# Patient Record
Sex: Male | Born: 1985 | Race: White | Hispanic: No | Marital: Married | Smoking: Never smoker
Health system: Southern US, Community
[De-identification: ages and names within clinical notes are randomized; demographics above are authoritative.]

## PROBLEM LIST (undated history)

## (undated) DIAGNOSIS — T8859XA Other complications of anesthesia, initial encounter: Secondary | ICD-10-CM

## (undated) DIAGNOSIS — T4145XA Adverse effect of unspecified anesthetic, initial encounter: Secondary | ICD-10-CM

## (undated) DIAGNOSIS — K219 Gastro-esophageal reflux disease without esophagitis: Secondary | ICD-10-CM

## (undated) HISTORY — PX: LAPAROSCOPIC NISSEN FUNDOPLICATION: SHX1932

---

## 2015-02-12 ENCOUNTER — Ambulatory Visit: Payer: BC Managed Care – PPO

## 2015-02-12 ENCOUNTER — Encounter: Payer: Self-pay | Admitting: Emergency Medicine

## 2015-02-12 ENCOUNTER — Ambulatory Visit
Admission: EM | Admit: 2015-02-12 | Discharge: 2015-02-12 | Disposition: A | Discharge status: Home / Self Care | Payer: BC Managed Care – PPO | Attending: Family Medicine | Admitting: Family Medicine

## 2015-02-12 DIAGNOSIS — J40 Bronchitis, not specified as acute or chronic: Secondary | ICD-10-CM

## 2015-02-12 MED ORDER — PREDNISONE 20 MG PO TABS: 20.0000 mg | ORAL_TABLET | Freq: Every day | ORAL | Status: DC

## 2015-02-12 MED ORDER — GUAIFENESIN-CODEINE 100-10 MG/5ML PO SOLN: 5.0000 mL | Freq: Three times a day (TID) | ORAL | Status: DC | PRN

## 2015-02-12 MED ORDER — AZITHROMYCIN 250 MG PO TABS: ORAL_TABLET | ORAL | Status: DC

## 2015-02-12 NOTE — ED Provider Notes (Signed)
[facility] Emergency Department Provider Note  ____________________________________________  Time seen: Approximately 3:22 PM  I have reviewed the triage vital signs and the nursing notes.   HISTORY  Chief Complaint Cough   HPI Erik FraiseChristopher Castaneda is a 29 y.o. male presents with a complaint of cough. Patient states that over the last 2-3 weeks he has had some runny nose, nasal congestion and sinus drainage. States the symptoms have improved but cough has continued and increased. Patient states biggest complaint is cough. States occasionally can cough up thick phlegm but states mostly the cough is dry. Denies chest pain, shortness breath or wheezing. Denies pain. Denies fever. Reports continues to eat and drink well.   History reviewed. No pertinent past medical history.  There are no active problems to display for this patient.   History reviewed. No pertinent past surgical history.  No current outpatient prescriptions on file.  Allergies Review of patient's allergies indicates no known allergies.  History reviewed. No pertinent family history.  Social History Social History  Substance Use Topics  . Smoking status: Never Smoker   . Smokeless tobacco: None  . Alcohol Use: Yes    Review of Systems Constitutional: No fever/chills Eyes: No visual changes. ENT: reports runny nose, nasal congestion and cough Cardiovascular: Denies chest pain. Respiratory: Denies shortness of breath. Gastrointestinal: No abdominal pain.  No nausea, no vomiting.  No diarrhea.  No constipation. Genitourinary: Negative for dysuria. Musculoskeletal: Negative for back pain. Skin: Negative for rash. Neurological: Negative for headaches, focal weakness or numbness.  10-point ROS otherwise negative.  ____________________________________________   PHYSICAL EXAM:  VITAL SIGNS: ED Triage Vitals  Enc Vitals Group     BP 02/12/15 1448 129/84 mmHg     Pulse Rate  02/12/15 1448 96     Resp 02/12/15 1448 17     Temp 02/12/15 1448 98.9 F (37.2 C)     Temp Source 02/12/15 1448 Tympanic     SpO2 02/12/15 1448 98 %     Weight 02/12/15 1448 172 lb (78.019 kg)     Height 02/12/15 1448 5\' 9"  (1.753 m)     Head Cir --      Peak Flow --      Pain Score 02/12/15 1451 3     Pain Loc --      Pain Edu? --      Excl. in GC? --    Constitutional: Alert and oriented. Well appearing and in no acute distress. Eyes: Conjunctivae are normal. PERRL. EOMI. Head: Atraumatic. Mild maxillary sinus TTP.  No frontal sinus TTP. No erythema. No swelling.   Ears: no erythema, normal TMs bilaterally.   Nose:mild nasal congestion with bilateral nasal turbinate bogginess.   Mouth/Throat: Mucous membranes are moist.  Oropharynx non-erythematous. Neck: No stridor.  No cervical spine tenderness to palpation. Hematological/Lymphatic/Immunilogical: No cervical lymphadenopathy. Cardiovascular: Normal rate, regular rhythm. Grossly normal heart sounds.  Good peripheral circulation. Respiratory: Normal respiratory effort.  No retractions. Bilateral base rhonchi. No wheezes or rales. Good air movement. Intermittent dry cough in room.  Gastrointestinal: Soft and nontender. No distention. Normal Bowel sounds.  No CVA tenderness. Musculoskeletal: No lower or upper extremity tenderness nor edema.  No joint effusions. Bilateral pedal pulses equal and easily palpated.  Neurologic:  Normal speech and language. No gross focal neurologic deficits are appreciated. No gait instability. Skin:  Skin is warm, dry and intact. No rash noted. Psychiatric: Mood and affect are normal. Speech and behavior are normal.  ____________________________________________  LABS (all labs ordered are listed, but only abnormal results are displayed)  Labs Reviewed - No data to display  RADIOLOGY  EXAM: CHEST 2 VIEW  COMPARISON: None.  FINDINGS: The heart size and mediastinal contours are within  normal limits. Both lungs are clear. The visualized skeletal structures are unremarkable.  IMPRESSION: No active cardiopulmonary disease.   Electronically Signed  By: Gaylyn Rong M.D.  On: 02/12/2015 15:43  I, [name], personally viewed and evaluated these images (plain radiographs) as part of my medical decision making.    INITIAL IMPRESSION / ASSESSMENT AND PLAN / ED COURSE  Pertinent labs & imaging results that were available during my care of the patient were reviewed by me and considered in my medical decision making (see chart for details).  Well-appearing patient. No acute distress. Presents with a complaint of recent runny nose, congestion that has improved but now with continued cough. Denies other complaints. Reports continues to eat and drink well. Patient with cough intermittently in room as well as with mild bilateral base rhonchi. Will evaluate by chest x-ray.  Chest xray no active cardiopulmonary disease. Will treat bronchitis with oral azithromycin, prn guaifenesin/codeine, x 3 days prednisone. Discussed supportive treatments including rest, fluids, prn tylenol. Discussed follow up with Primary care physician this week. Discussed follow up and return parameters including no resolution or any worsening concerns. Patient verbalized understanding and agreed to plan.   _________________________________________   FINAL CLINICAL IMPRESSION(S) / ED DIAGNOSES  Final diagnoses:  Bronchitis       [name], NP 02/12/15 1612

## 2015-02-12 NOTE — ED Notes (Signed)
Patient c/o cough and chest congestion since Friday.

## 2016-03-11 ENCOUNTER — Ambulatory Visit
Admission: RE | Admit: 2016-03-11 | Discharge: 2016-03-11 | Disposition: A | Discharge status: Home / Self Care | Payer: BC Managed Care – PPO | Source: Ambulatory Visit | Attending: Otolaryngology | Admitting: Otolaryngology

## 2016-03-11 ENCOUNTER — Encounter
Admission: RE | Disposition: A | Discharge status: Home / Self Care | Payer: Self-pay | Source: Ambulatory Visit | Attending: Otolaryngology

## 2016-03-11 ENCOUNTER — Ambulatory Visit: Payer: BC Managed Care – PPO | Admitting: Anesthesiology

## 2016-03-11 DIAGNOSIS — K219 Gastro-esophageal reflux disease without esophagitis: Secondary | ICD-10-CM | POA: Insufficient documentation

## 2016-03-11 DIAGNOSIS — J3489 Other specified disorders of nose and nasal sinuses: Secondary | ICD-10-CM | POA: Insufficient documentation

## 2016-03-11 DIAGNOSIS — J343 Hypertrophy of nasal turbinates: Secondary | ICD-10-CM | POA: Diagnosis not present

## 2016-03-11 DIAGNOSIS — J3501 Chronic tonsillitis: Secondary | ICD-10-CM | POA: Insufficient documentation

## 2016-03-11 DIAGNOSIS — J342 Deviated nasal septum: Secondary | ICD-10-CM | POA: Insufficient documentation

## 2016-03-11 HISTORY — DX: Other complications of anesthesia, initial encounter: T88.59XA

## 2016-03-11 HISTORY — PX: ENDOSCOPIC CONCHA BULLOSA RESECTION: SHX6395

## 2016-03-11 HISTORY — DX: Gastro-esophageal reflux disease without esophagitis: K21.9

## 2016-03-11 HISTORY — PX: SEPTOPLASTY: SHX2393

## 2016-03-11 HISTORY — PX: TURBINATE REDUCTION: SHX6157

## 2016-03-11 HISTORY — DX: Adverse effect of unspecified anesthetic, initial encounter: T41.45XA

## 2016-03-11 HISTORY — PX: TONSILLECTOMY: SHX5217

## 2016-03-11 SURGERY — SEPTOPLASTY, NOSE
Anesthesia: General | Site: Throat | Wound class: Clean Contaminated

## 2016-03-11 MED ORDER — OXYCODONE HCL 5 MG/5ML PO SOLN
5.0000 mg | Freq: Once | ORAL | Status: AC | PRN
  Administered 2016-03-11: 5 mg via ORAL

## 2016-03-11 MED ORDER — OXYCODONE HCL 5 MG PO TABS: 5.0000 mg | ORAL_TABLET | Freq: Once | ORAL | Status: AC | PRN

## 2016-03-11 MED ORDER — LACTATED RINGERS IV SOLN
INTRAVENOUS | Status: DC
  Administered 2016-03-11: 10:00:00 via INTRAVENOUS

## 2016-03-11 MED ORDER — LIDOCAINE-EPINEPHRINE 1 %-1:100000 IJ SOLN
INTRAMUSCULAR | Status: DC | PRN
  Administered 2016-03-11: 4.5 mL

## 2016-03-11 MED ORDER — GLYCOPYRROLATE 0.2 MG/ML IJ SOLN
INTRAMUSCULAR | Status: DC | PRN
  Administered 2016-03-11: 0.2 mg via INTRAVENOUS

## 2016-03-11 MED ORDER — MIDAZOLAM HCL 5 MG/5ML IJ SOLN
INTRAMUSCULAR | Status: DC | PRN
  Administered 2016-03-11: 2 mg via INTRAVENOUS

## 2016-03-11 MED ORDER — MEPERIDINE HCL 25 MG/ML IJ SOLN: 6.2500 mg | INTRAMUSCULAR | Status: DC | PRN

## 2016-03-11 MED ORDER — ACETAMINOPHEN 10 MG/ML IV SOLN
1000.0000 mg | Freq: Once | INTRAVENOUS | Status: AC
  Administered 2016-03-11: 1000 mg via INTRAVENOUS

## 2016-03-11 MED ORDER — HYDROMORPHONE HCL 1 MG/ML IJ SOLN
0.2500 mg | INTRAMUSCULAR | Status: DC | PRN
  Administered 2016-03-11: 0.3 mg via INTRAVENOUS
  Administered 2016-03-11: 0.4 mg via INTRAVENOUS
  Administered 2016-03-11: 0.3 mg via INTRAVENOUS

## 2016-03-11 MED ORDER — PROPOFOL 10 MG/ML IV BOLUS
INTRAVENOUS | Status: DC | PRN
  Administered 2016-03-11: 50 mg via INTRAVENOUS
  Administered 2016-03-11: 200 mg via INTRAVENOUS
  Administered 2016-03-11: 80 mg via INTRAVENOUS

## 2016-03-11 MED ORDER — ONDANSETRON HCL 4 MG/2ML IJ SOLN
INTRAMUSCULAR | Status: DC | PRN
  Administered 2016-03-11: 4 mg via INTRAVENOUS

## 2016-03-11 MED ORDER — PHENYLEPHRINE HCL 0.5 % NA SOLN
NASAL | Status: DC | PRN
  Administered 2016-03-11: 30 mL via TOPICAL

## 2016-03-11 MED ORDER — PROMETHAZINE HCL 25 MG/ML IJ SOLN: 6.2500 mg | INTRAMUSCULAR | Status: DC | PRN

## 2016-03-11 MED ORDER — OXYMETAZOLINE HCL 0.05 % NA SOLN
2.0000 | Freq: Once | NASAL | Status: AC
  Administered 2016-03-11: 2 via NASAL

## 2016-03-11 MED ORDER — LIDOCAINE HCL (CARDIAC) 20 MG/ML IV SOLN
INTRAVENOUS | Status: DC | PRN
  Administered 2016-03-11: 50 mg via INTRAVENOUS

## 2016-03-11 MED ORDER — SCOPOLAMINE 1 MG/3DAYS TD PT72
1.0000 | MEDICATED_PATCH | TRANSDERMAL | Status: DC
  Administered 2016-03-11: 1.5 mg via TRANSDERMAL

## 2016-03-11 MED ORDER — SUCCINYLCHOLINE CHLORIDE 20 MG/ML IJ SOLN
INTRAMUSCULAR | Status: DC | PRN
  Administered 2016-03-11: 100 mg via INTRAVENOUS

## 2016-03-11 MED ORDER — DEXTROSE 5 % IV SOLN
2000.0000 mg | Freq: Once | INTRAVENOUS | Status: AC
  Administered 2016-03-11: 2000 mg via INTRAVENOUS

## 2016-03-11 MED ORDER — DEXAMETHASONE SODIUM PHOSPHATE 4 MG/ML IJ SOLN
INTRAMUSCULAR | Status: DC | PRN
  Administered 2016-03-11: 10 mg via INTRAVENOUS

## 2016-03-11 MED ORDER — FENTANYL CITRATE (PF) 100 MCG/2ML IJ SOLN
INTRAMUSCULAR | Status: DC | PRN
  Administered 2016-03-11: 100 ug via INTRAVENOUS

## 2016-03-11 SURGICAL SUPPLY — 34 items
BLADE BOVIE TIP EXT 4 (BLADE) ×5 IMPLANT
CANISTER SUCT 1200ML W/VALVE (MISCELLANEOUS) ×5 IMPLANT
CATH IV 18X1 1/4 SAFELET (CATHETERS) ×5 IMPLANT
COAGULATOR SUCT 8FR VV (MISCELLANEOUS) ×5 IMPLANT
DRAPE HEAD BAR (DRAPES) ×5 IMPLANT
ELECT CAUTERY BLADE TIP 2.5 (TIP) ×10
ELECT REM PT RETURN 9FT ADLT (ELECTROSURGICAL) ×5
ELECTRODE CAUTERY BLDE TIP 2.5 (TIP) ×6 IMPLANT
ELECTRODE REM PT RTRN 9FT ADLT (ELECTROSURGICAL) ×3 IMPLANT
GLOVE PI ULTRA LF STRL 7.5 (GLOVE) ×9 IMPLANT
GLOVE PI ULTRA NON LATEX 7.5 (GLOVE) ×6
IV CATH 18X1 1/4 SAFELET (CATHETERS) ×3
KIT ROOM TURNOVER OR (KITS) ×5 IMPLANT
NEEDLE ANESTHESIA  27G X 3.5 (NEEDLE) ×2
NEEDLE ANESTHESIA 27G X 3.5 (NEEDLE) ×3 IMPLANT
NS IRRIG 500ML POUR BTL (IV SOLUTION) IMPLANT
PACK DRAPE NASAL/ENT (PACKS) ×5 IMPLANT
PACK TONSIL/ADENOIDS (PACKS) ×5 IMPLANT
PACKING NASAL EPIS 4X2.4 XEROG (MISCELLANEOUS) IMPLANT
PAD GROUND ADULT SPLIT (MISCELLANEOUS) ×5 IMPLANT
PATTIES SURGICAL .5 X3 (DISPOSABLE) ×5 IMPLANT
PENCIL ELECTRO HAND CTR (MISCELLANEOUS) ×10 IMPLANT
SOL ANTI-FOG 6CC FOG-OUT (MISCELLANEOUS) ×3 IMPLANT
SOL FOG-OUT ANTI-FOG 6CC (MISCELLANEOUS) ×2
SPLINT NASAL SEPTAL BLV .50 ST (MISCELLANEOUS) ×5 IMPLANT
STRAP BODY AND KNEE 60X3 (MISCELLANEOUS) ×10 IMPLANT
SUT CHROMIC 3-0 (SUTURE) ×2
SUT CHROMIC 3-0 KS 27XMFL CR (SUTURE) ×3
SUT ETHILON 3-0 KS 30 BLK (SUTURE) ×5 IMPLANT
SUT PLAIN GUT 4-0 (SUTURE) ×5 IMPLANT
SUTURE CHRMC 3-0 KS 27XMFL CR (SUTURE) ×3 IMPLANT
SYR 3ML LL SCALE MARK (SYRINGE) ×5 IMPLANT
TOWEL OR 17X26 4PK STRL BLUE (TOWEL DISPOSABLE) ×5 IMPLANT
WATER STERILE IRR 250ML POUR (IV SOLUTION) ×5 IMPLANT

## 2016-03-11 NOTE — Anesthesia Preprocedure Evaluation (Addendum)
Anesthesia Evaluation  Patient identified by MRN, date of birth, ID band  Reviewed: Allergy & Precautions, NPO status , Unable to perform ROS - Chart review only  Airway Mallampati: I  TM Distance: >3 FB Neck ROM: Full    Dental   Poor dentition:   Pulmonary neg pulmonary ROS,    Pulmonary exam normal        Cardiovascular negative cardio ROS Normal cardiovascular exam     Neuro/Psych negative neurological ROS     GI/Hepatic Neg liver ROS, GERD (s/p nissen)  Controlled,  Endo/Other  negative endocrine ROS  Renal/GU negative Renal ROS     Musculoskeletal negative musculoskeletal ROS (+)   Abdominal   Peds  Hematology negative hematology ROS (+)   Anesthesia Other Findings   Reproductive/Obstetrics                            Anesthesia Physical Anesthesia Plan  ASA: I  Anesthesia Plan: General   Post-op Pain Management:    Induction: Intravenous  Airway Management Planned: Oral ETT  Additional Equipment:   Intra-op Plan:   Post-operative Plan:   Informed Consent: I have reviewed the patients History and Physical, chart, labs and discussed the procedure including the risks, benefits and alternatives for the proposed anesthesia with the patient or authorized representative who has indicated his/her understanding and acceptance.     Plan Discussed with: CRNA  Anesthesia Plan Comments:         Anesthesia Quick Evaluation

## 2016-03-11 NOTE — H&P (Signed)
  H&P has been reviewed and no changes necessary. To be downloaded later. 

## 2016-03-11 NOTE — Anesthesia Procedure Notes (Signed)
Procedure Name: Intubation Date/Time: 03/11/2016 11:09 AM Performed by: Andee PolesBUSH, Yasha Tibbett Pre-anesthesia Checklist: Patient identified, Emergency Drugs available, Suction available, Patient being monitored and Timeout performed Patient Re-evaluated:Patient Re-evaluated prior to inductionOxygen Delivery Method: Circle system utilized Preoxygenation: Pre-oxygenation with 100% oxygen Intubation Type: IV induction Ventilation: Mask ventilation without difficulty Laryngoscope Size: Mac and 3 Grade View: Grade I Tube type: Oral Rae Tube size: 7.0 mm Number of attempts: 1 Placement Confirmation: ETT inserted through vocal cords under direct vision,  positive ETCO2 and breath sounds checked- equal and bilateral Tube secured with: Tape Dental Injury: Teeth and Oropharynx as per pre-operative assessment

## 2016-03-11 NOTE — Anesthesia Postprocedure Evaluation (Signed)
Anesthesia Post Note  Patient: Erik FraiseChristopher Castaneda  Procedure(s) Performed: Procedure(s) (LRB): SEPTOPLASTY (N/A) TONSILLECTOMY (N/A) ENDOSCOPIC CONCHA BULLOSA RESECTION (Bilateral) PARTIAL TURBINATE REDUCTION (Bilateral)  Patient location during evaluation: PACU Anesthesia Type: General Level of consciousness: awake and alert and oriented Pain management: pain level controlled Vital Signs Assessment: post-procedure vital signs reviewed and stable Respiratory status: spontaneous breathing and nonlabored ventilation Cardiovascular status: stable Postop Assessment: no signs of nausea or vomiting and adequate PO intake Anesthetic complications: no    Harolyn RutherfordJoshua Dayona Shaheen

## 2016-03-11 NOTE — Op Note (Signed)
03/11/2016  12:35 PM  [id number]   Pre-Op Dx:  Deviated Nasal Septum, Hypertrophic Inferior Turbinates, conchal bullosa of the middle turbinates, chronic tonsillitis  Post-op Dx: Same  Proc: Nasal Septoplasty, bilateral endoscopic trimming of conchal bullosa, Bilateral Partial Reduction Inferior Turbinates, tonsillectomy   Surg:  Baily Serpe H  Anes:  GOT  EBL:  100 mL  Comp:  None  Findings: Very deviated septum inferiorly in the left side pinching in the inferior turbinate. Large conchal bullosa both middle turbinates. Very cryptic enlarged tonsils.  Procedure: With the patient in a comfortable supine position,  general orotracheal anesthesia was induced without difficulty.     The patient received preoperative Afrin spray for topical decongestion and vasoconstriction.  Intravenous prophylactic antibiotics were administered.  At an appropriate level, the patient was placed in a semi-sitting position.  Nasal vibrissae were trimmed.   1% Xylocaine with 1:100,000 epinephrine, 6 cc's, was infiltrated into the anterior floor of the nose, into the nasal spine region, into the membranous columella, and finally into the submucoperichondrial plane of the septum on both sides.  Several minutes were allowed for this to take effect.  Cottoniod pledgetts soaked in Afrin and 4% Xylocaine were placed into both nasal cavities and left while the patient was prepped and draped in the standard fashion.  The materials were removed from the nose and observed to be intact and correct in number.  The nose was inspected with a headlight and a 0 scope with the findings as described above.  A left Killian incision was sharply executed and carried down to the quadrangular cartilage. The mucoperichondrium was elelvated along the quadrangular plate back to the bony-cartilaginous junction. The mucoperiostium was then elevated along the ethmoid plate and the vomer. The boney-catilaginous junction was then split  with a freer elevator and the mucoperiosteum was elevated on the opposite side. The mucoperiosteum was then elevated along the maxillary crest as needed to expose the crooked bone of the crest.  Boney spurs of the vomer and maxillary crest were removed with Lenoria Chimeakahashi forceps.  The cartilaginous plate was trimmed along its posterior and inferior borders of about 2 mm of cartilage to free it up inferiorly. Some of the deviated ethmoid plate was then fractured and removed with Takahashi forceps to free up the posterior border of the quadrangular plate and allow it to swing back to the midline. The mucosal flaps were placed back into their anatomic position to allow visualization of the airways. The septum now sat in the midline with an improved airway.  A 3-0 Chromic suture on a Keith needle in used to anchor the inferior septum at the nasal spine with a through and through suture. The mucosal flaps are then sutured together using a through and through whip stitch of 4-0 Plain Gut with a mini-Keith needle. This was used to close the HudsonKillian incision as well.   The 0 scope was used to visualize the middle turbinates. The middle turbinates had conchal bullosa were very wide anteriorly. An incision was created and anterior border and the anterior edge of the left turbinate was trimmed along with removing the lateral wall the conchal bullosa. Electrocautery was used along the trimmed edge to help control any oozing. This was repeated on the opposite side. Xerogel was then placed over the middle turbinate remnants on both sides to help control crusting and bleeding.  The inferior turbinates were then inspected. An incision was created along the inferior aspect of the left inferior turbinate with removal of  some of the inferior soft tissue and bone. Electrocautery was used to control bleeding in the area. The remaining turbinate was then outfractured to open up the airway further. There was no significant bleeding  noted. The right turbinate was then trimmed and outfractured in a similar fashion.  The airways were then visualized and showed open passageways on both sides that were significantly improved compared to before surgery. There was no signifcant bleeding. Nasal splints were applied to both sides of the septum using Xomed 0.565mm regular sized splints that were trimmed, and then held in position with a 3-0 Nylon through and through suture.  The patient was then turned and placed in a Trendelenburg position. A Davis mouth gag was used to visualize the oropharynx. His tonsils were very large and cryptic. The tonsils were grasped medially and the anterior pillar incised using electrocautery. The tonsil was dissected from its fossa using blunt dissection and electrocautery. This was repeated on the opposite side. He was controlled both sides well there is no further bleeding noted. This left significantly more room in his oropharynx.  The patient was turned back over to anesthesia, and awakened, extubated, and taken to the PACU in satisfactory condition.  Dispo:   PACU to home  Plan: Ice, elevation, narcotic analgesia, steroid taper, and prophylactic antibiotics for the duration of indwelling nasal foreign bodies.  We will reevaluate the patient in the office in 6 days and remove the septal splints.  Return to work in 21 days, can begin more strenuous activities in two weeks.   Saysha Menta H 03/11/2016 12:35 PM

## 2016-03-11 NOTE — Discharge Instructions (Signed)
T & A INSTRUCTION SHEET - [facility] °[facility] ° °CREIGHTON VAUGHT, MD °PAUL H. JUENGEL, MD  °P. SCOTT BENNETT °CHAPMAN MCQUEEN, MD ° °[address] , [place] [address] TEL. [phone] °[address] [place] [address] [phone] ° °INFORMATION SHEET FOR A TONSILLECTOMY AND ADENDOIDECTOMY ° °About Your Tonsils and Adenoids ° The tonsils and adenoids are normal body tissues that are part of our immune system.  They normally help to protect us against diseases that may enter our mouth and nose.  However, sometimes the tonsils and/or adenoids become too large and obstruct our breathing, especially at night. °  ° If either of these things happen it helps to remove the tonsils and adenoids in order to become healthier. The operation to remove the tonsils and adenoids is called a tonsillectomy and adenoidectomy. ° °The Location of Your Tonsils and Adenoids ° The tonsils are located in the back of the throat on both side and sit in a cradle of muscles. The adenoids are located in the roof of the mouth, behind the nose, and closely associated with the opening of the Eustachian tube to the ear. ° °Surgery on Tonsils and Adenoids ° A tonsillectomy and adenoidectomy is a short operation which takes about thirty minutes.  This includes being put to sleep and being awakened.  Tonsillectomies and adenoidectomies are performed at [facility] and may require observation period in the recovery room prior to going home. ° °Following the Operation for a Tonsillectomy ° A cautery machine is used to control bleeding.  Bleeding from a tonsillectomy and adenoidectomy is minimal and postoperatively the risk of bleeding is approximately four percent, although this rarely life threatening. ° ° ° °After your tonsillectomy and adenoidectomy post-op care at home: ° °1. Our patients are able to go home the same day.  You may be given prescriptions for pain  medications and antibiotics, if indicated. °2. It is extremely important to remember that fluid intake is of utmost importance after a tonsillectomy.  The amount that you drink must be maintained in the postoperative period.  A good indication of whether a child is getting enough fluid is whether his/her urine output is constant.  As long as children are urinating or wetting their diaper every 6 - 8 hours this is usually enough fluid intake.   °3. Although rare, this is a risk of some bleeding in the first ten days after surgery.  This is usually occurs between day five and nine postoperatively.  This risk of bleeding is approximately four percent.  If you or your child should have any bleeding you should remain calm and notify our office or go directly to the Emergency Room at [facility] where they will contact us. Our doctors are available seven days a week for notification.  We recommend sitting up quietly in a chair, place an ice pack on the front of the neck and spitting out the blood gently until we are able to contact you.  Adults should gargle gently with ice water and this may help stop the bleeding.  If the bleeding does not stop after a short time, i.e. 10 to 15 minutes, or seems to be increasing again, please contact us or go to the hospital.   °4. It is common for the pain to be worse at 5 - 7 days postoperatively.  This occurs because the “scab” is peeling off and the mucous membrane (skin of   the throat) is growing back where the tonsils were.   5. It is common for a low-grade fever, less than 102, during the first week after a tonsillectomy and adenoidectomy.  It is usually due to not drinking enough liquids, and we suggest your use liquid Tylenol or the pain medicine with Tylenol prescribed in order to keep your temperature below 102.  Please follow the directions on the back of the bottle. 6. Do not take aspirin or any products that contain aspirin such as Bufferin, Anacin,  Ecotrin, aspirin gum, Goodies, BC headache powders, etc., after a T&A because it can promote bleeding.  Please check with our office before administering any other medication that may been prescribed by other doctors during the two week post-operative period. 7. If you happen to look in the mirror or into your childs mouth you will see white/gray patches on the back of the throat.  This is what a scab looks like in the mouth and is normal after having a T&A.  It will disappear once the tonsil area heals completely. However, it may cause a noticeable odor, and this too will disappear with time.     8. You or your child may experience ear pain after having a T&A.  This is called referred pain and comes from the throat, but it is felt in the ears.  Ear pain is quite common and expected.  It will usually go away after ten days.  There is usually nothing wrong with the ears, and it is primarily due to the healing area stimulating the nerve to the ear that runs along the side of the throat.  Use either the prescribed pain medicine or Tylenol as needed.  9. The throat tissues after a tonsillectomy are obviously sensitive.  Smoking around children who have had a tonsillectomy significantly increases the risk of bleeding.  DO NOT SMOKE!   ENDOSCOPIC SINUS SURGERY [facility]  What is the Surgery Like, and what is the Recovery?  The Surgery usually takes a couple of hours to perform, and is usually performed under a general anesthetic (completely asleep).  Patients are usually discharged home after a couple of hours.  Sometimes during surgery it is necessary to pack the nose to control bleeding, and the packing is left in place for 24 - 48 hours, and removed by your surgeon.  If a septoplasty was performed during the procedure, there is often a splint placed which must be removed after 5-7 days.   Discomfort: Pain is usually mild to moderate, and can be controlled by prescription pain  medication or acetaminophen (Tylenol).  Aspirin, Ibuprofen (Advil, Motrin), or Naprosyn (Aleve) should be avoided, as they can cause increased bleeding.  Most patients feel sinus pressure like they have a bad head cold for several days.  Sleeping with your head elevated can help reduce swelling and facial pressure, as can ice packs over the face.  A humidifier may be helpful to keep the mucous and blood from drying in the nose.   Diet: There are no specific diet restrictions, however, you should generally start with clear liquids and a light diet of bland foods because the anesthetic can cause some nausea.  Advance your diet depending on how your stomach feels.  Taking your pain medication with food will often help reduce stomach upset which pain medications can cause.  Nasal Saline Irrigation: It is important to remove blood clots and dried mucous from the nose as it is healing.  This is done by having you irrigate the nose at least 3 - 4 times daily with a salt water solution.  We recommend using NeilMed Sinus Rinse (available at the drug store).  Fill the squeeze bottle with the solution, bend over a sink, and insert the tip of the squeeze bottle into the nose  of an inch.  Point the tip of the squeeze bottle towards the inside corner of the eye on the same side your irrigating.  Squeeze the bottle and gently irrigate the nose.  If you bend forward as you do this, most of the fluid will flow back out of the nose, instead of down your throat.   The solution should be warm, near body temperature, when you irrigate.   Each time you irrigate, you should use a full squeeze bottle.   Note that if you are instructed to use Nasal Steroid Sprays at any time after your surgery, irrigate with saline BEFORE using the steroid spray, so you do not wash it all out of the nose. Another product, Nasal Saline Gel (such as AYR Nasal Saline Gel) can be applied in each nostril 3 - 4 times daily to moisture the nose and reduce  scabbing or crusting.  Bleeding:  Bloody drainage from the nose can be expected for several days, and patients are instructed to irrigate their nose frequently with salt water to help remove mucous and blood clots.  The drainage may be dark red or brown, though some fresh blood may be seen intermittently, especially after irrigation.  Do not blow you nose, as bleeding may occur. If you must sneeze, keep your mouth open to allow air to escape through your mouth.  If heavy bleeding occurs: Irrigate the nose with saline to rinse out clots, then spray the nose 3 - 4 times with Afrin Nasal Decongestant Spray.  The spray will constrict the blood vessels to slow bleeding.  Pinch the lower half of your nose shut to apply pressure, and lay down with your head elevated.  Ice packs over the nose may help as well. If bleeding persists despite these measures, you should notify your doctor.  Do not use the Afrin routinely to control nasal congestion after surgery, as it can result in worsening congestion and may affect healing.     Activity: Return to work varies among patients. Most patients will be out of work at least 5 - 7 days to recover.  Patient may return to work after they are off of narcotic pain medication, and feeling well enough to perform the functions of their job.  Patients must avoid heavy lifting (over 10 pounds) or strenuous physical for 2 weeks after surgery, so your employer may need to assign you to light duty, or keep you out of work longer if light duty is not possible.  NOTE: you should not drive, operate dangerous machinery, do any mentally demanding tasks or make any important legal or financial decisions while on narcotic pain medication and recovering from the general anesthetic.    Call Your Doctor Immediately if You Have Any of the Following: 1. Bleeding that you cannot control with the above measures 2. Loss of vision, double vision, bulging of the eye or black eyes. 3. Fever over 101  degrees 4. Neck stiffness with severe headache, fever, nausea and change in mental state. You are always encourage to call anytime with concerns, however, please call with requests for pain medication refills during office hours.  Office Endoscopy: During follow-up visits  your doctor will remove any packing or splints that may have been placed and evaluate and clean your sinuses endoscopically.  Topical anesthetic will be used to make this as comfortable as possible, though you may want to take your pain medication prior to the visit.  How often this will need to be done varies from patient to patient.  After complete recovery from the surgery, you may need follow-up endoscopy from time to time, particularly if there is concern of recurrent infection or nasal polyps.   General Anesthesia, Adult, Care After Refer to this sheet in the next few weeks. These instructions provide you with information on caring for yourself after your procedure. Your health care provider may also give you more specific instructions. Your treatment has been planned according to current medical practices, but problems sometimes occur. Call your health care provider if you have any problems or questions after your procedure. WHAT TO EXPECT AFTER THE PROCEDURE After the procedure, it is typical to experience:  Sleepiness.  Nausea and vomiting. HOME CARE INSTRUCTIONS  For the first 24 hours after general anesthesia:  Have a responsible person with you.  Do not drive a car. If you are alone, do not take public transportation.  Do not drink alcohol.  Do not take medicine that has not been prescribed by your health care provider.  Do not sign important papers or make important decisions.  You may resume a normal diet and activities as directed by your health care provider.  Change bandages (dressings) as directed.  If you have questions or problems that seem related to general anesthesia, call the hospital and ask  for the anesthetist or anesthesiologist on call. SEEK MEDICAL CARE IF:  You have nausea and vomiting that continue the day after anesthesia.  You develop a rash. SEEK IMMEDIATE MEDICAL CARE IF:   You have difficulty breathing.  You have chest pain.  You have any allergic problems.   This information is not intended to replace advice given to you by your health care provider. Make sure you discuss any questions you have with your health care provider.   Document Released: 07/26/2000 Document Revised: 05/10/2014 Document Reviewed: 08/18/2011 Elsevier Interactive Patient Education [employer]

## 2016-03-11 NOTE — Transfer of Care (Signed)
Immediate Anesthesia Transfer of Care Note  Patient: Erik FraiseChristopher Seif  Procedure(s) Performed: Procedure(s): SEPTOPLASTY (N/A) TONSILLECTOMY (N/A) ENDOSCOPIC CONCHA BULLOSA RESECTION (Bilateral) PARTIAL TURBINATE REDUCTION (Bilateral)  Patient Location: PACU  Anesthesia Type: General  Level of Consciousness: awake, alert  and patient cooperative  Airway and Oxygen Therapy: Patient Spontanous Breathing and Patient connected to supplemental oxygen  Post-op Assessment: Post-op Vital signs reviewed, Patient's Cardiovascular Status Stable, Respiratory Function Stable, Patent Airway and No signs of Nausea or vomiting  Post-op Vital Signs: Reviewed and stable  Complications: No apparent anesthesia complications

## 2016-03-12 ENCOUNTER — Encounter: Payer: Self-pay | Admitting: Otolaryngology

## 2016-03-16 LAB — SURGICAL PATHOLOGY

## 2016-05-03 IMAGING — CR DG CHEST 2V
2 series · 3 of 3 positions shown · non-contrast
Comparison: None.

CLINICAL DATA: Dry cough and congestion for 5 days. Chest
tightness.

EXAM:
CHEST  2 VIEW

[Series 1: chest pa · 0.14mm/px · 2 of 2 slices shown]
[im 1/2]
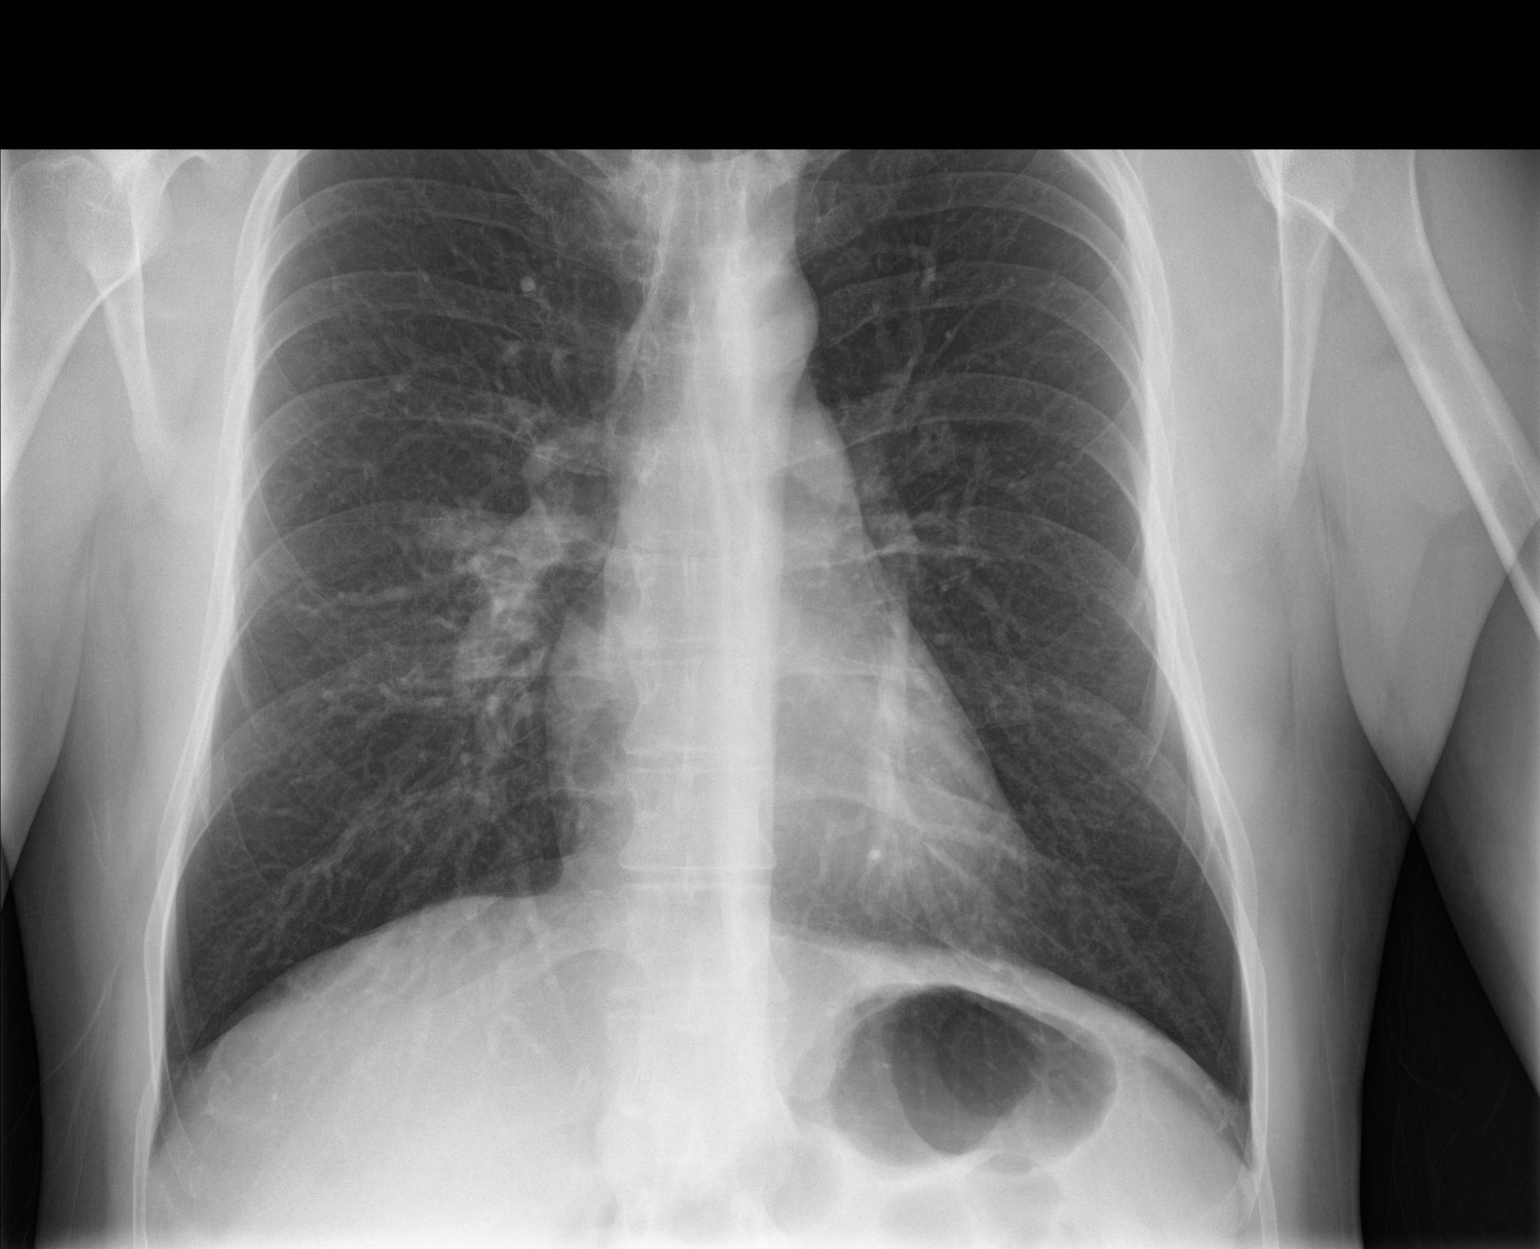
[im 2/2]
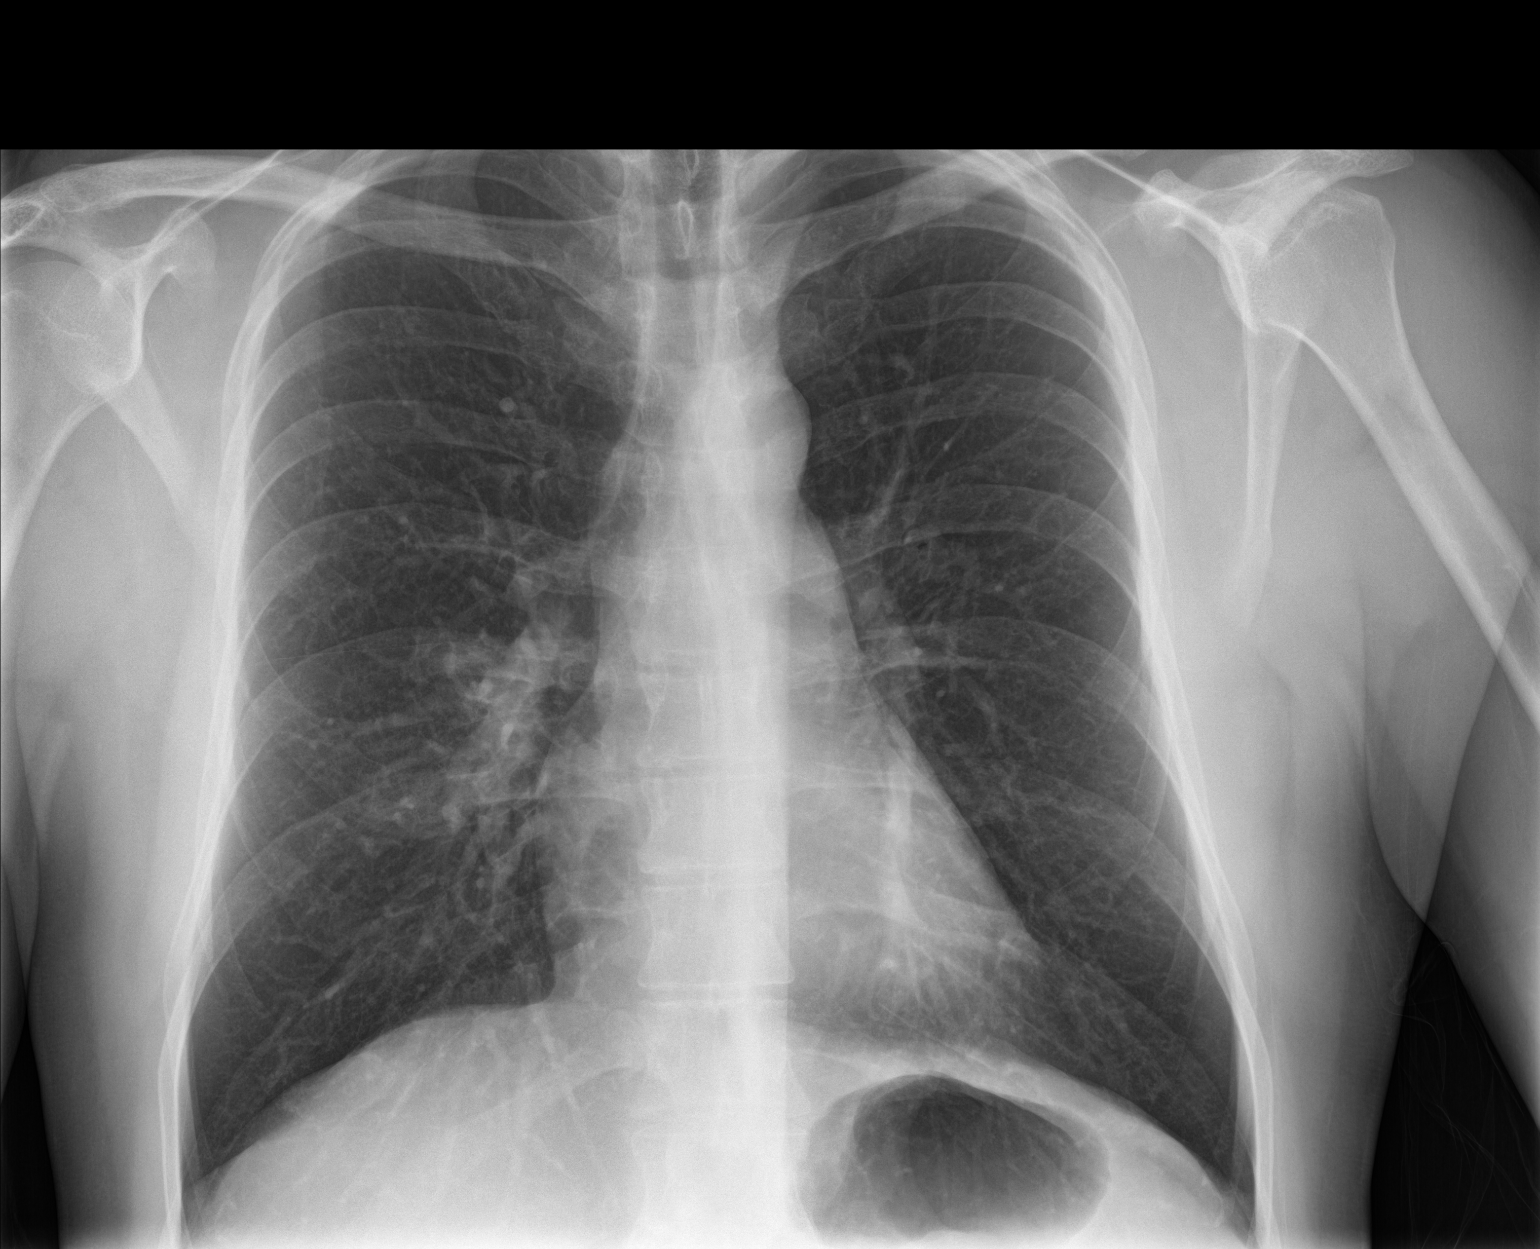

[chest lat]
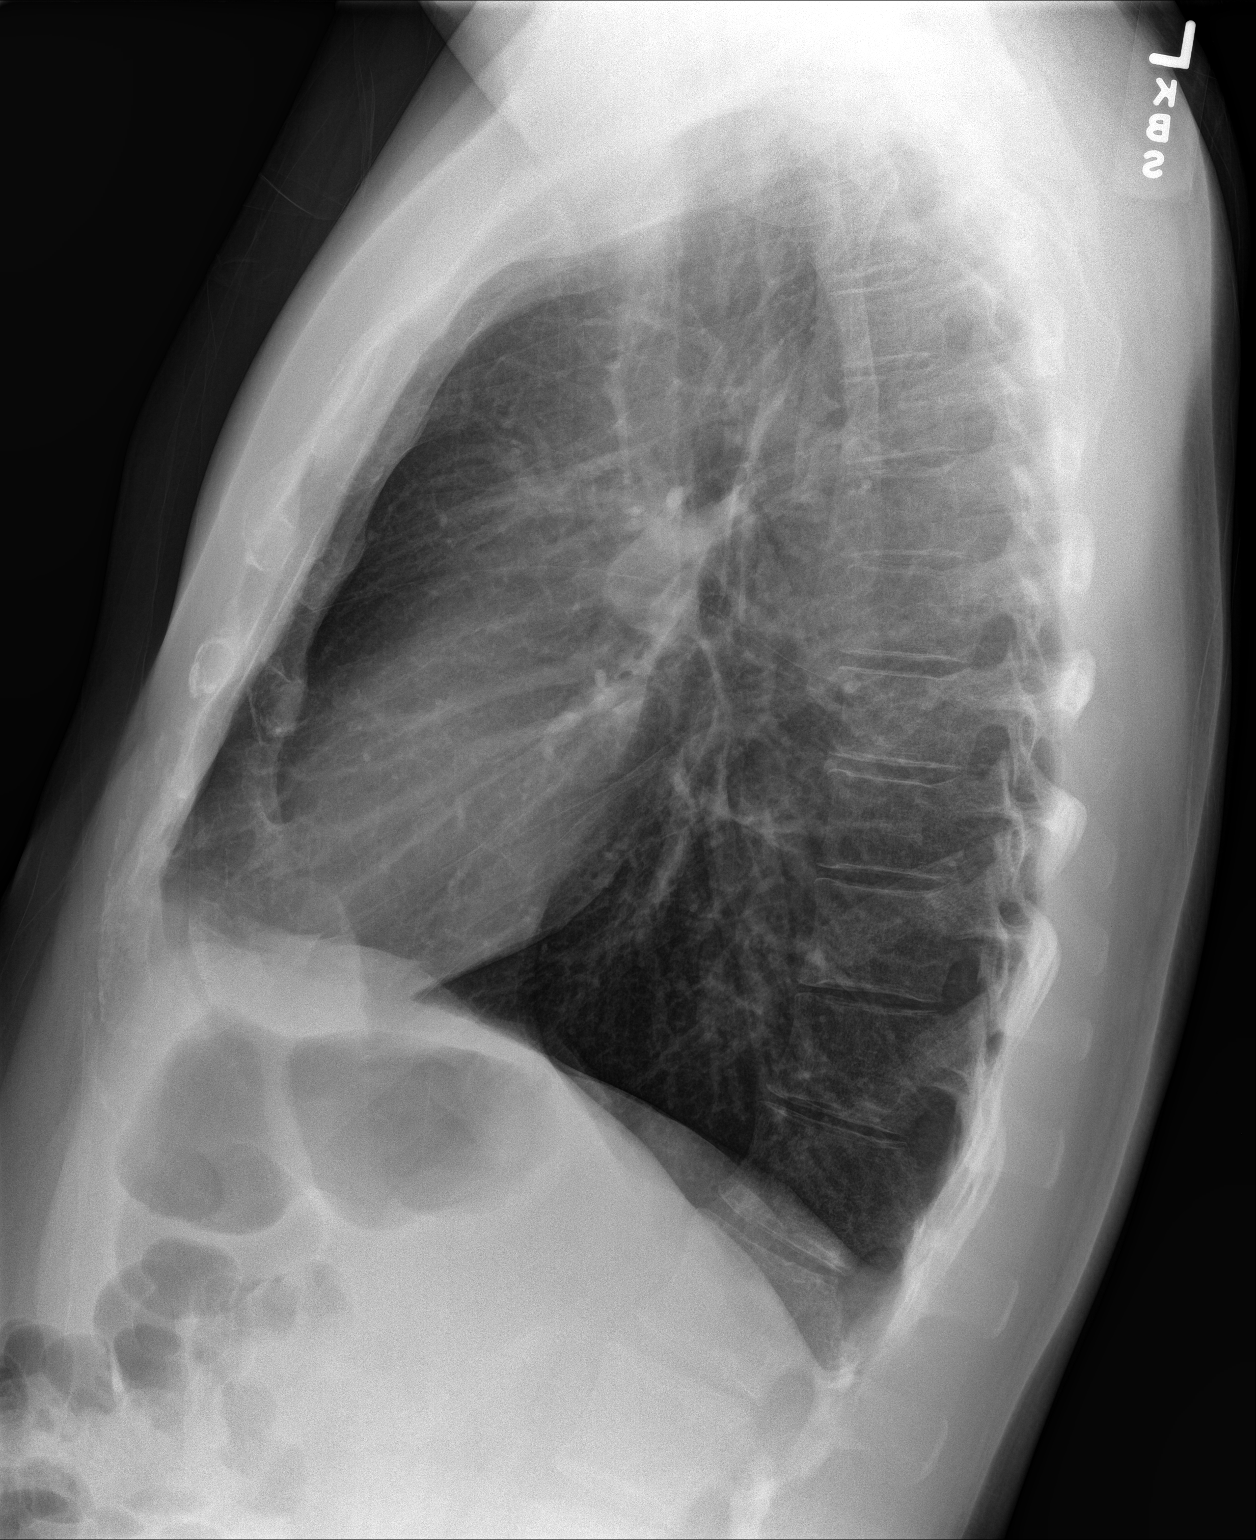

[3 of 3 positions shown; findings below may reference images not displayed]

FINDINGS: The heart size and mediastinal contours are within normal limits.
Both lungs are clear. The visualized skeletal structures are
unremarkable.
IMPRESSION: No active cardiopulmonary disease.

## 2019-06-22 ENCOUNTER — Encounter: Payer: Self-pay | Admitting: Physical Therapy

## 2019-06-22 ENCOUNTER — Ambulatory Visit: Payer: BC Managed Care – PPO | Attending: Otolaryngology | Admitting: Physical Therapy

## 2019-06-22 ENCOUNTER — Other Ambulatory Visit: Payer: Self-pay

## 2019-06-22 DIAGNOSIS — R42 Dizziness and giddiness: Secondary | ICD-10-CM | POA: Diagnosis present

## 2019-06-22 DIAGNOSIS — H8111 Benign paroxysmal vertigo, right ear: Secondary | ICD-10-CM | POA: Insufficient documentation

## 2019-06-22 NOTE — Therapy (Signed)
[facility] [address] [place], [place], [address] Phone: [phone]   Fax:  [phone]  Physical Therapy Evaluation  Patient Details  Name: Erik Castaneda MRN: [id number] Date of Birth: 06-30-1985 Referring Provider (PT): [name]   Encounter Date: 06/22/2019  PT End of Session - 06/22/19 0944    Visit Number  1    Number of Visits  13    Date for PT Re-Evaluation  09/21/19    PT Start Time  0857    PT Stop Time  0955    PT Time Calculation (min)  58 min    Activity Tolerance  Patient tolerated treatment well    Behavior During Therapy  [facility] for tasks assessed/performed       Past Medical History:  Diagnosis Date  . Complication of anesthesia    slow to wake up and oxygen level dropped  . GERD (gastroesophageal reflux disease)     Past Surgical History:  Procedure Laterality Date  . ENDOSCOPIC CONCHA BULLOSA RESECTION Bilateral 03/11/2016   Procedure: ENDOSCOPIC CONCHA BULLOSA RESECTION;  Surgeon: [name], MD;  Location: [facility];  Service: ENT;  Laterality: Bilateral;  . LAPAROSCOPIC NISSEN FUNDOPLICATION    . SEPTOPLASTY N/A 03/11/2016   Procedure: SEPTOPLASTY;  Surgeon: [name], MD;  Location: [facility];  Service: ENT;  Laterality: N/A;  . TONSILLECTOMY N/A 03/11/2016   Procedure: TONSILLECTOMY;  Surgeon: [name], MD;  Location: [facility];  Service: ENT;  Laterality: N/A;  . TURBINATE REDUCTION Bilateral 03/11/2016   Procedure: PARTIAL TURBINATE REDUCTION;  Surgeon: [name], MD;  Location: [facility];  Service: ENT;  Laterality: Bilateral;    There were no vitals filed for this visit.   Subjective Assessment - 06/22/19 1056    Subjective  Patient reports that he was playing with his daughter and when he rolled over he had episode of vertigo. Patient reports his episodes of dizziness have worsened in the past month.    Pertinent History   History obtained form patient and medical record. Patient reports that he began to have issues with dizziness and imbalance in January of 2020. Patient reports that he saw [name] in January 2020 and workup identified hypertension as possible cause of his symptoms. Patient was referred to cardiology. Patient states he was also diagnosed with sleep apnea and he now uses a CPAP machine. Patient reports that his dizziness symptoms began to worsen in January 2021. Patient saw [name], neurologist on May 10, 2019 for sleep follow up. Patient had VNG testing on May 18, 2019 which revealed 57% right unilateral vestibular weakness, decreased pursuit and optokinetic gain and positive right BPPV per MR with subtle down-beating nystagmus present in head-hanging postion. Patient was treated for right BPPV after the VNG test. Patient reports initially this helped mildly reduce his symptoms but reports then he began to have worsening dizziness. Patient states he had a brain MRI two weeks ago that revealed no acute intracranial abnormality. Patient reports that he became dizzy when trying to get up after the MRI and patient states that the MRI technician could observe patient's eyes moving, describing nystagmus. Patient reports he is having vertigo describes as "swirling" sensation and occasional unsteadiness that are motion provoked and positional in nature. Patient reports dizziness lasts a few minutes. Patient reports that he is getting episodes every time he lies down or sits up so multiple times per day. Patient reports that lying down, sitting  up and rolling over brings on his dizziness. Patient reports that nothing makes his symptoms better that he knows of and he has not tried Meclizine or antivert. Patient reports when he was a teenager that "the words mash together on the paper" when he would be reading and that he began to wear glasses. Patient reports he sometimes will get a "pulsing" headaches on center  top, back or right side of the brain. Patient reports he gets these headaches infrequently and reports on average it occurs 1-2 times a month. Patient reports no history of migraines. Patient does report occasional visual changes where he sees "twinkling stars" and states these episodes are not associated with his headaches.    Diagnostic tests  MRI: no acute intracranial abnormality and VNG testing: 57% right unilateral vestibular weakness, decreased pursuit and optokinetic gain and positive right BPPV per MR with subtle down-beating nystagmus present in head-hanging postion    Patient Stated Goals  to be able to play with his daughter and to be able to asemble projects at home-like be able to build a bookcase; to have decreased dizziness    Currently in Pain?  --   none stated        [facility] PT Assessment - 06/22/19 1017      Assessment   Medical Diagnosis  right unilateral vestibular weakness H81.8X1    Referring Provider (PT)  [name]    Onset Date/Surgical Date  --   January 2020   Prior Therapy  no prior vestibular PT therapy      Precautions   Precautions  Fall      Restrictions   Weight Bearing Restrictions  No      Balance Screen   Has the patient fallen in the past 6 months  No    Has the patient had a decrease in activity level because of a fear of falling?   No    Is the patient reluctant to leave their home because of a fear of falling?   No      Home Environment   Living Environment  Private residence    Living Arrangements  Spouse/significant other;Children    Available Help at Discharge  Friend(s);Family      Prior Function   Level of Independence  Independent with community mobility without device      Cognition   Overall Cognitive Status  Within Functional Limits for tasks assessed         VESTIBULAR AND BALANCE EVALUATION   HISTORY:  Subjective history of current problem: History obtained form patient and medical record. Patient reports  that he began to have issues with dizziness and imbalance in January of 2020. Patient reports that he saw [name] in January 2020 and workup identified hypertension as possible cause of his symptoms. Patient was referred to cardiology. Patient states he was also diagnosed with sleep apnea and he now uses a CPAP machine. Patient reports that his dizziness symptoms began to worsen in January 2021. Patient saw [name], neurologist on May 10, 2019 for sleep follow up. Patient had VNG testing on May 18, 2019 which revealed 57% right unilateral vestibular weakness, decreased pursuit and optokinetic gain and positive right BPPV per MR with subtle down-beating nystagmus present in head-hanging postion. Patient was treated for right BPPV after the VNG test. Patient reports initially this helped mildly reduce his symptoms but reports then he began to have worsening dizziness. Patient states he had a brain MRI two weeks ago  that revealed no acute intracranial abnormality. Patient reports that he became dizzy when trying to get up after the MRI and patient states that the MRI technician could observe patient's eyes moving, describing nystagmus. Patient reports he is having vertigo describes as "swirling" sensation and occasional unsteadiness that are motion provoked and positional in nature. Patient reports dizziness lasts a few minutes. Patient reports that he is getting episodes every time he lies down or sits up so multiple times per day. Patient reports that lying down, sitting up and rolling over brings on his dizziness. Patient reports that nothing makes his symptoms better that he knows of and he has not tried Meclizine or antivert. Patient reports when he was a teenager that "the words mash together on the paper" when he would be reading and that he began to wear glasses. Patient reports he sometimes will get a "pulsing" headaches on center top, back or right side of the brain. Patient reports he gets  these headaches infrequently and reports on average it occurs 1-2 times a month. Patient reports no history of migraines. Patient does report occasional visual changes where he sees "twinkling stars" and states these episodes are not associated with his headaches.    Progression of symptoms: worse History of similar episodes:  Falls (yes/no): no Number of falls in past 6 months: 0  Auditory complaints (tinnitus, pain, drainage): denies Vision (last eye exam, diplopia, recent changes): reports saws he will get "twinkling stars" in his eyes a few times. Patient reports he does not get headaches afterwards. Patient wears glasses. Patient last got his eyes check a few years ago. Patient has transition lenses. Recommend seeing eye doctor since it has been several years since he was last seen.     EXAMINATION   SOMATOSENSORY:  Any N & T in extremities or weakness: denies  COORDINATION: Finger to Nose:  Normal Past Pointing:    Normal  MUSCULOSKELETAL SCREEN: Cervical Spine ROM: Cervical AROM WNL cervical flexion, extension, right and left rotation  Gait: Patient arrives ambulating without AD. Patient ambulates with step through gait pattern with reciprocal arm swing with fair cadence. Scanning of visual environment with gait is: fair  Balance: Deferred balance testing this date secondary to positive Right Dix-Hallpike testing. TBA  POSTURAL CONTROL TESTS:  Clinical Test of Sensory Interaction for Balance (CTSIB): deferred this date secondary to positive R BPPV  OCULOMOTOR / VESTIBULAR TESTING:  Oculomotor Exam- Room Light  Normal Abnormal Comments  Ocular Alignment N    Ocular ROM N    Spontaneous Nystagmus N    Gaze evoked Nystagmus N    Smooth Pursuit N    Saccades N  Very slight hypometric saccades noted  VOR   Deferred secondary to positive R BPPV  VOR Cancellation   Staying in focus; states feels the background is starting to move a little   Left Head Impulse   Deferred  secondary to positive R BPPV  Right Head Impulse   Deferred secondary to positive R BPPV  Head Shaking Nystagmus   Deferred secondary to positive R BPPV  Static Acuity   Deferred secondary to positive R BPPV  Dynamic Acuity   Deferred secondary to positive R BPPV     BPPV TESTS:  Symptoms Duration Intensity Nystagmus  Left Dix-Hallpike None   None observed  Right Dix-Hallpike Vertigo  10/10 Right upbeating torsional nystagmus of short duration and then saw slight, subtle downwardly beating nystagmus    FUNCTIONAL OUTCOME MEASURES:  Results Comments  DHI   48 /100 Moderate perception of handicap; in need of intervention  ABC Scale     44.4% High Falls risk; in need of intervention  FOTO      70 (out of 100) Given the patient's risk adjustment variables, like-patients nationally had a FS score of 68 at intake    Canalith Repositioning Manuever: On mat table, performed right Dix-Hallpike testing and it was positive with right upbeating torsional nystagmus of short duration without latency. Performed right canalith repositioning maneuver (CRT). Repeated right CRT for a total of 2 maneuvers with retesting between maneuvers. Patient reported 10/10 vertigo with first and 2/10 with second maneuver.    PT Education - 06/22/19 1054    Education Details  discussed plan of care and goals; discussed BPPV and Epley maneuver; provided handout on BPPV from [email or web address] and showed YouTube video of right + BPPV nystagmus    Person(s) Educated  Patient    Methods  Explanation;Verbal cues;Handout    Comprehension  Verbalized understanding       PT Short Term Goals - 06/22/19 0946      PT SHORT TERM GOAL #1   Title  Patient will report 50% or greater improvement in his symptoms of dizziness and imbalance with provoking motions or positions.    Time  4    Period  Weeks    Status  New    Target Date  07/20/19        PT Long Term Goals - 06/22/19 0946      PT LONG TERM GOAL #1   Title   Patient will report 80% or greater improvement in his symptoms of dizziness and imbalance with provoking motions or positions.    Time  12    Period  Weeks    Status  New    Target Date  09/21/19      PT LONG TERM GOAL #2   Title  Patient will report that he is able to resume his prior level of activity including being able to play with his daughter and to be able to build and assemble projects at home without episodes of vertigo.    Time  12    Period  Weeks    Status  New    Target Date  09/21/19      PT LONG TERM GOAL #3   Title  Patient will reduce perceived disability to low levels as indicated by <40 on Dizziness Handicap Inventory to demonstrate significant improvement in dizziness.    Time  12    Period  Weeks    Status  New    Target Date  09/21/19      PT LONG TERM GOAL #4   Title  Patient will reduce falls risk as indicated by Activities Specific Balance Confidence Scale (ABC) >67%.    Time  12    Period  Weeks    Status  New    Target Date  09/21/19             Plan - 06/22/19 0945    Clinical Impression Statement  Patient reports worsening symptoms of dizziness, vertigo and occasional unsteadiness that began in Juanuary 2020. 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 Patient with positive right BPPV this date and performed Epley maneuvers. Of note, did observe slight downwardbeating nystagmus once the upward beating right torsional nysatgmus subsided when in  the right Dix-Hallpike head hanging position. Patient scored 44.4% on the ABC scale and 48/100 on the [facility] indicating falls risk and modearte perception of handicap. Patient would benefit from PT services to address functional deficits, address goals and to try to decrease patient's subjective symptoms of dizziness and imbalance.    Personal Factors and Comorbidities   Comorbidity 3+;Time since onset of injury/illness/exacerbation    Comorbidities  obstructive sleep apnea, HTN, herpes zoster    Examination-Activity Limitations  Bend;Bed Mobility;Other   lying down, sitting up and rolling over   Examination-Participation Restrictions  --   playing with his daughter and home projects like building/assembling items   Stability/Clinical Decision Making  Evolving/Moderate complexity    Clinical Decision Making  Moderate    Rehab Potential  Excellent    PT Frequency  1x / week    PT Duration  12 weeks    PT Treatment/Interventions  Canalith Repostioning;Balance training;Neuromuscular re-education;Therapeutic exercise;Gait training;Stair training;Therapeutic activities;Patient/family education;Vestibular    PT Next Visit Plan  repeat right Dix-Hallpike test and perform EPley maneuver if indicated    Consulted and Agree with Plan of Care  Patient       Patient will benefit from skilled therapeutic intervention in order to improve the following deficits and impairments:  Decreased balance, Dizziness  Visit Diagnosis: BPPV (benign paroxysmal positional vertigo), right  Dizziness and giddiness     Problem List There are no problems to display for this patient.  Lady Deutscher PT, DPT [phone] Lady Deutscher 06/22/2019, 11:22 AM  [facility] [address] [place], [place], [address] Phone: [phone]   Fax:  [phone]  Name: Lizandro Spellman MRN: [id number] Date of Birth: 1985-11-05

## 2019-06-29 ENCOUNTER — Ambulatory Visit: Payer: BC Managed Care – PPO | Admitting: Physical Therapy

## 2019-06-29 ENCOUNTER — Encounter: Payer: Self-pay | Admitting: Physical Therapy

## 2019-06-29 DIAGNOSIS — H8111 Benign paroxysmal vertigo, right ear: Secondary | ICD-10-CM

## 2019-06-29 DIAGNOSIS — R42 Dizziness and giddiness: Secondary | ICD-10-CM

## 2019-06-29 NOTE — Therapy (Signed)
[facility] [address] [place], [place], [address] Phone: [phone]   Fax:  [phone]  Physical Therapy Treatment  Patient Details  Name: Erik Castaneda MRN: [id number] Date of Birth: 1986/02/05 Referring Provider (PT): [name]   Encounter Date: 06/29/2019  PT End of Session - 06/29/19 0954    Visit Number  2    Number of Visits  13    Date for PT Re-Evaluation  09/21/19    PT Start Time  0859    PT Stop Time  0931    PT Time Calculation (min)  32 min    Activity Tolerance  Patient tolerated treatment well    Behavior During Therapy  [facility] for tasks assessed/performed       Past Medical History:  Diagnosis Date  . Complication of anesthesia    slow to wake up and oxygen level dropped  . GERD (gastroesophageal reflux disease)     Past Surgical History:  Procedure Laterality Date  . ENDOSCOPIC CONCHA BULLOSA RESECTION Bilateral 03/11/2016   Procedure: ENDOSCOPIC CONCHA BULLOSA RESECTION;  Surgeon: [name], MD;  Location: [facility];  Service: ENT;  Laterality: Bilateral;  . LAPAROSCOPIC NISSEN FUNDOPLICATION    . SEPTOPLASTY N/A 03/11/2016   Procedure: SEPTOPLASTY;  Surgeon: [name], MD;  Location: [facility];  Service: ENT;  Laterality: N/A;  . TONSILLECTOMY N/A 03/11/2016   Procedure: TONSILLECTOMY;  Surgeon: [name], MD;  Location: [facility];  Service: ENT;  Laterality: N/A;  . TURBINATE REDUCTION Bilateral 03/11/2016   Procedure: PARTIAL TURBINATE REDUCTION;  Surgeon: [name], MD;  Location: [facility];  Service: ENT;  Laterality: Bilateral;    There were no vitals filed for this visit.  Subjective Assessment - 06/29/19 0901    Subjective  Patient states that the first half of the week he was feeling better, but reports his symptoms started to come back at the end of the week. Patient states he has had a few episodes of vertigo.    Pertinent  History  History obtained form patient and medical record. Patient reports that he began to have issues with dizziness and imbalance in January of 2020. Patient reports that he saw [name] in January 2020 and workup identified hypertension as possible cause of his symptoms. Patient was referred to cardiology. Patient states he was also diagnosed with sleep apnea and he now uses a CPAP machine. Patient reports that his dizziness symptoms began to worsen in January 2021. Patient saw [name], neurologist on May 10, 2019 for sleep follow up. Patient had VNG testing on May 18, 2019 which revealed 57% right unilateral vestibular weakness, decreased pursuit and optokinetic gain and positive right BPPV per MR with subtle down-beating nystagmus present in head-hanging postion. Patient was treated for right BPPV after the VNG test. Patient reports initially this helped mildly reduce his symptoms but reports then he began to have worsening dizziness. Patient states he had a brain MRI two weeks ago that revealed no acute intracranial abnormality. Patient reports that he became dizzy when trying to get up after the MRI and patient states that the MRI technician could observe patient's eyes moving, describing nystagmus. Patient reports he is having vertigo describes as "swirling" sensation and occasional unsteadiness that are motion provoked and positional in nature. Patient reports dizziness lasts a few minutes. Patient reports that he is getting episodes every time he lies down or sits up so multiple times per day. Patient  reports that lying down, sitting up and rolling over brings on his dizziness. Patient reports that nothing makes his symptoms better that he knows of and he has not tried Meclizine or antivert. Patient reports when he was a teenager that "the words mash together on the paper" when he would be reading and that he began to wear glasses. Patient reports he sometimes will get a "pulsing" headaches  on center top, back or right side of the brain. Patient reports he gets these headaches infrequently and reports on average it occurs 1-2 times a month. Patient reports no history of migraines. Patient does report occasional visual changes where he sees "twinkling stars" and states these episodes are not associated with his headaches.    Diagnostic tests  MRI: no acute intracranial abnormality and VNG testing: 57% right unilateral vestibular weakness, decreased pursuit and optokinetic gain and positive right BPPV per MR with subtle down-beating nystagmus present in head-hanging postion    Patient Stated Goals  to be able to play with his daughter and to be able to asemble projects at home-like be able to build a bookcase; to have decreased dizziness      Canalith Repositioning Manuever: On mat table, performed right Dix-Hallpike testing and it was potentially positive with no nystagmus observed this date, but patient reported 5/10 vertigo. Performed right canalith repositioning maneuver (CRT). Repeated right CRT for a total of 3 maneuvers with retesting between maneuvers. On the second and third maneuver, patient denied vertigo, but did observe a few beats about 3-5 of right torsional movement and then noted a few beats of down beating nystagmus which is a potential central sign.   PT Education - 06/29/19 [phone]    Education Details  discussed plan of care and Epley maneuver    Person(s) Educated  Patient    Methods  Explanation    Comprehension  Verbalized understanding       PT Short Term Goals - 06/22/19 0946      PT SHORT TERM GOAL #1   Title  Patient will report 50% or greater improvement in his symptoms of dizziness and imbalance with provoking motions or positions.    Time  4    Period  Weeks    Status  New    Target Date  07/20/19        PT Long Term Goals - 06/22/19 0946      PT LONG TERM GOAL #1   Title  Patient will report 80% or greater improvement in his symptoms of  dizziness and imbalance with provoking motions or positions.    Time  12    Period  Weeks    Status  New    Target Date  09/21/19      PT LONG TERM GOAL #2   Title  Patient will report that he is able to resume his prior level of activity including being able to play with his daughter and to be able to build and assemble projects at home without episodes of vertigo.    Time  12    Period  Weeks    Status  New    Target Date  09/21/19      PT LONG TERM GOAL #3   Title  Patient will reduce perceived disability to low levels as indicated by <40 on Dizziness Handicap Inventory to demonstrate significant improvement in dizziness.    Time  12    Period  Weeks    Status  New  Target Date  09/21/19      PT LONG TERM GOAL #4   Title  Patient will reduce falls risk as indicated by Activities Specific Balance Confidence Scale (ABC) >67%.    Time  12    Period  Weeks    Status  New    Target Date  09/21/19            Plan - 06/29/19 0955    Clinical Impression Statement  Patient reports that he initially felt good after Epley maneuver was performed at last session, but states towards the end of the week he began had a few episodes of vertigo again. Patient reported 5/10 vertigo in right Dix-Hallpike test and no nystagmus was observed in room light. Performed Epley maneuvers secondary to patient's reports of vertigo and that he had had a few episodes of vertigo since last session as well. Will plan on repeating Right Dix-Hallpike test and if negative will begin exercises to address the right vestibular hypofunction such as VOR X 1 and activities with head turns. Patient would benefit from continued PT services to further address goals.    Personal Factors and Comorbidities  Comorbidity 3+;Time since onset of injury/illness/exacerbation    Comorbidities  obstructive sleep apnea, HTN, herpes zoster    Examination-Activity Limitations  Bend;Bed Mobility;Other   lying down, sitting up and  rolling over   Examination-Participation Restrictions  --   playing with his daughter and home projects like building/assembling items   Stability/Clinical Decision Making  Evolving/Moderate complexity    Rehab Potential  Excellent    PT Frequency  1x / week    PT Duration  12 weeks    PT Treatment/Interventions  Canalith Repostioning;Balance training;Neuromuscular re-education;Therapeutic exercise;Gait training;Stair training;Therapeutic activities;Patient/family education;Vestibular    PT Next Visit Plan  repeat right Dix-Hallpike test and perform EPley maneuver if indicated    Consulted and Agree with Plan of Care  Patient       Patient will benefit from skilled therapeutic intervention in order to improve the following deficits and impairments:  Decreased balance, Dizziness  Visit Diagnosis: Dizziness and giddiness  BPPV (benign paroxysmal positional vertigo), right     Problem List There are no problems to display for this patient.  Lady Deutscher PT, DPT [phone] Lady Deutscher 06/29/2019, 9:59 AM  [facility] [address] [place], [place], [address] Phone: [phone]   Fax:  [phone]  Name: Erik Castaneda MRN: [id number] Date of Birth: Jul 25, 1985

## 2019-07-06 ENCOUNTER — Ambulatory Visit: Payer: BC Managed Care – PPO | Attending: Otolaryngology | Admitting: Physical Therapy

## 2019-07-06 ENCOUNTER — Encounter: Payer: Self-pay | Admitting: Physical Therapy

## 2019-07-06 ENCOUNTER — Other Ambulatory Visit: Payer: Self-pay

## 2019-07-06 DIAGNOSIS — R42 Dizziness and giddiness: Secondary | ICD-10-CM | POA: Diagnosis present

## 2019-07-06 DIAGNOSIS — H8111 Benign paroxysmal vertigo, right ear: Secondary | ICD-10-CM | POA: Diagnosis present

## 2019-07-06 NOTE — Therapy (Signed)
[place] [facility] [address] [place], [place], [address] Phone: [phone]   Fax:  [phone]  Physical Therapy Treatment  Patient Details  Name: Erik Castaneda MRN: [id number] Date of Birth: 11/11/1985 Referring Provider (PT): [name]   Encounter Date: 07/06/2019  PT End of Session - 07/06/19 0956    Visit Number  3    Number of Visits  13    Date for PT Re-Evaluation  09/21/19    PT Start Time  0906    PT Stop Time  0950    PT Time Calculation (min)  44 min    Equipment Utilized During Treatment  Gait belt    Activity Tolerance  Patient tolerated treatment well    Behavior During Therapy  [facility] for tasks assessed/performed       Past Medical History:  Diagnosis Date  . Complication of anesthesia    slow to wake up and oxygen level dropped  . GERD (gastroesophageal reflux disease)     Past Surgical History:  Procedure Laterality Date  . ENDOSCOPIC CONCHA BULLOSA RESECTION Bilateral 03/11/2016   Procedure: ENDOSCOPIC CONCHA BULLOSA RESECTION;  Surgeon: [name], MD;  Location: [facility];  Service: ENT;  Laterality: Bilateral;  . LAPAROSCOPIC NISSEN FUNDOPLICATION    . SEPTOPLASTY N/A 03/11/2016   Procedure: SEPTOPLASTY;  Surgeon: [name], MD;  Location: [facility];  Service: ENT;  Laterality: N/A;  . TONSILLECTOMY N/A 03/11/2016   Procedure: TONSILLECTOMY;  Surgeon: [name], MD;  Location: [facility];  Service: ENT;  Laterality: N/A;  . TURBINATE REDUCTION Bilateral 03/11/2016   Procedure: PARTIAL TURBINATE REDUCTION;  Surgeon: [name], MD;  Location: [facility];  Service: ENT;  Laterality: Bilateral;    There were no vitals filed for this visit.  Subjective Assessment - 07/06/19 0955    Subjective  Patient reports he build a mobile cart and had to move his head up and down and reports he felt a little off but no vertigo. Patient reports he did not have any  vertigo with getting into bed and rolling this past week. Patient states he has a six month cardiology check-up next Tuesday.    Pertinent History  History obtained form patient and medical record. Patient reports that he began to have issues with dizziness and imbalance in January of 2020. Patient reports that he saw [name] in January 2020 and workup identified hypertension as possible cause of his symptoms. Patient was referred to cardiology. Patient states he was also diagnosed with sleep apnea and he now uses a CPAP machine. Patient reports that his dizziness symptoms began to worsen in January 2021. Patient saw [name], neurologist on May 10, 2019 for sleep follow up. Patient had VNG testing on May 18, 2019 which revealed 57% right unilateral vestibular weakness, decreased pursuit and optokinetic gain and positive right BPPV per MR with subtle down-beating nystagmus present in head-hanging postion. Patient was treated for right BPPV after the VNG test. Patient reports initially this helped mildly reduce his symptoms but reports then he began to have worsening dizziness. Patient states he had a brain MRI two weeks ago that revealed no acute intracranial abnormality. Patient reports that he became dizzy when trying to get up after the MRI and patient states that the MRI technician could observe patient's eyes moving, describing nystagmus. Patient reports he is having vertigo describes as "swirling" sensation and occasional unsteadiness that are motion provoked and positional in nature. Patient  reports dizziness lasts a few minutes. Patient reports that he is getting episodes every time he lies down or sits up so multiple times per day. Patient reports that lying down, sitting up and rolling over brings on his dizziness. Patient reports that nothing makes his symptoms better that he knows of and he has not tried Meclizine or antivert. Patient reports when he was a teenager that "the words mash  together on the paper" when he would be reading and that he began to wear glasses. Patient reports he sometimes will get a "pulsing" headaches on center top, back or right side of the brain. Patient reports he gets these headaches infrequently and reports on average it occurs 1-2 times a month. Patient reports no history of migraines. Patient does report occasional visual changes where he sees "twinkling stars" and states these episodes are not associated with his headaches.    Diagnostic tests  MRI: no acute intracranial abnormality and VNG testing: 57% right unilateral vestibular weakness, decreased pursuit and optokinetic gain and positive right BPPV per MR with subtle down-beating nystagmus present in head-hanging postion    Patient Stated Goals  to be able to play with his daughter and to be able to asemble projects at home-like be able to build a bookcase; to have decreased dizziness       Patient reports he build a mobile cart and had to move his head up and down and reports he felt a little off but no vertigo. Patient reports he did not have any vertigo with getting into bed and rolling this past week.  Patient states he has a six month cardiology check-up next Tuesday.   Neuromuscular Re-education:  Dix-Hallpike: Performed right Dix-Hallpike tests and it was negative with patient denying vertigo and no upbeating, torsional nystagmus observed, but did observe a few slow beats of downbeating nystagmus and patient reported some blurring of his vision.  VOR X 1 exercise:  Demonstrated and educated as to VOR X1.  Patient performed VOR X 1 horizontal in sitting 1 rep of 30 seconds and 2 reps of 1 minute each with verbal cues for technique.  Patient reports 1-2/10 dizziness after 30 second rep and 4-5/10 dizziness after 1 minute rep.  Added VOR X 1 for HEP.   Ambulation with head turns:  Patient performed 175' trials of forwards and retro ambulation with horizontal and vertical head turns with  CGA.   Patient demonstrates mild trunk sway/imbalance at times with ambulation with head turns.  Patient performed 54' of retro ambulation with horizontal head turns with CGA. Patient did take a small sidestep one time to correct loss of balance and noted some mild trunk sway/imbalance. Patient reports that retro ambulation was more difficult and that he felt more imbalance when turning his head to the right side.  Patient reports mild imbalance with ambulation with head turns and reports horizontal head turns were more challenging than vertical turns.   Airex pad:  On firm surface and then on Airex pad, patient performed feet together progressions and semi-tandem progressions with alternating lead leg with and without horizontal and vertical head turns with CGA.  On Airex pad, performed feet together with body turns with CGA.  Patient reports mild increase in dizziness with these activities.   PT Education - 07/06/19 0956    Education Details  discussed plan of care and issued HEP including feet together and semi-tandem stance progressions with head and body turns and VOR X 1 in sitting one minute reps  Person(s) Educated  Patient    Methods  Explanation;Demonstration;Verbal cues;Handout    Comprehension  Verbalized understanding;Returned demonstration       PT Short Term Goals - 06/22/19 0946      PT SHORT TERM GOAL #1   Title  Patient will report 50% or greater improvement in his symptoms of dizziness and imbalance with provoking motions or positions.    Time  4    Period  Weeks    Status  New    Target Date  07/20/19        PT Long Term Goals - 06/22/19 0946      PT LONG TERM GOAL #1   Title  Patient will report 80% or greater improvement in his symptoms of dizziness and imbalance with provoking motions or positions.    Time  12    Period  Weeks    Status  New    Target Date  09/21/19      PT LONG TERM GOAL #2   Title  Patient will report that he is able to resume his  prior level of activity including being able to play with his daughter and to be able to build and assemble projects at home without episodes of vertigo.    Time  12    Period  Weeks    Status  New    Target Date  09/21/19      PT LONG TERM GOAL #3   Title  Patient will reduce perceived disability to low levels as indicated by <40 on Dizziness Handicap Inventory to demonstrate significant improvement in dizziness.    Time  12    Period  Weeks    Status  New    Target Date  09/21/19      PT LONG TERM GOAL #4   Title  Patient will reduce falls risk as indicated by Activities Specific Balance Confidence Scale (ABC) >67%.    Time  12    Period  Weeks    Status  New    Target Date  09/21/19            Plan - 07/06/19 0957    Clinical Impression Statement  Patient with negative right Dix-Hallpike test and patient reports that he has had no further episodes of vertigo since last visit. Patient able to start working on VOR X1 and activities with head turns and body turns in the clinic and provided these activities for HEP. Patient would benefit from continued PT services to further address balance deficits and to try to reduce patient's subjective symptoms of dizziness and imbalance and to address goals. Plan on reviewing HEP next visit, performing the DGI and working on progressions of activities.    Personal Factors and Comorbidities  Comorbidity 3+;Time since onset of injury/illness/exacerbation    Comorbidities  obstructive sleep apnea, HTN, herpes zoster    Examination-Activity Limitations  Bend;Bed Mobility;Other   lying down, sitting up and rolling over   Examination-Participation Restrictions  --   playing with his daughter and home projects like building/assembling items   Stability/Clinical Decision Making  Evolving/Moderate complexity    Rehab Potential  Excellent    PT Frequency  1x / week    PT Duration  12 weeks    PT Treatment/Interventions  Canalith Repostioning;Balance  training;Neuromuscular re-education;Therapeutic exercise;Gait training;Stair training;Therapeutic activities;Patient/family education;Vestibular    PT Next Visit Plan  repeat right Dix-Hallpike test and perform EPley maneuver if indicated    Consulted and Agree with Plan of  Care  Patient       Patient will benefit from skilled therapeutic intervention in order to improve the following deficits and impairments:  Decreased balance, Dizziness  Visit Diagnosis: Dizziness and giddiness     Problem List There are no problems to display for this patient.  Mardelle Matte PT, DPT [phone] Mardelle Matte 07/06/2019, 10:09 AM  [place] [facility] [address] [place], [place], [address] Phone: [phone]   Fax:  [phone]  Name: Erik Castaneda MRN: [id number] Date of Birth: [birth year]

## 2019-07-13 ENCOUNTER — Ambulatory Visit: Payer: BC Managed Care – PPO | Admitting: Physical Therapy

## 2019-07-13 ENCOUNTER — Encounter: Payer: Self-pay | Admitting: Physical Therapy

## 2019-07-13 DIAGNOSIS — R42 Dizziness and giddiness: Secondary | ICD-10-CM

## 2019-07-13 DIAGNOSIS — H8111 Benign paroxysmal vertigo, right ear: Secondary | ICD-10-CM

## 2019-07-13 NOTE — Therapy (Addendum)
[facility] [address] [place], [place], [address] Phone: [phone]   Fax:  [phone]  Physical Therapy Treatment  Patient Details  Name: Erik Castaneda MRN: [id number] Date of Birth: [birth year] Referring Provider (PT): [name]   Encounter Date: 07/13/2019  PT End of Session - 07/13/19 0907    Visit Number  4    Number of Visits  13    Date for PT Re-Evaluation  09/21/19    PT Start Time  0904    PT Stop Time  0945    PT Time Calculation (min)  41 min    Equipment Utilized During Treatment  Gait belt    Activity Tolerance  Patient tolerated treatment well    Behavior During Therapy  [facility] for tasks assessed/performed       Past Medical History:  Diagnosis Date  . Complication of anesthesia    slow to wake up and oxygen level dropped  . GERD (gastroesophageal reflux disease)     Past Surgical History:  Procedure Laterality Date  . ENDOSCOPIC CONCHA BULLOSA RESECTION Bilateral 03/11/2016   Procedure: ENDOSCOPIC CONCHA BULLOSA RESECTION;  Surgeon: Erik Murders, MD;  Location: [facility];  Service: ENT;  Laterality: Bilateral;  . LAPAROSCOPIC NISSEN FUNDOPLICATION    . SEPTOPLASTY N/A 03/11/2016   Procedure: SEPTOPLASTY;  Surgeon: Erik Murders, MD;  Location: [facility];  Service: ENT;  Laterality: N/A;  . TONSILLECTOMY N/A 03/11/2016   Procedure: TONSILLECTOMY;  Surgeon: Erik Murders, MD;  Location: [facility];  Service: ENT;  Laterality: N/A;  . TURBINATE REDUCTION Bilateral 03/11/2016   Procedure: PARTIAL TURBINATE REDUCTION;  Surgeon: Erik Murders, MD;  Location: [facility];  Service: ENT;  Laterality: Bilateral;    There were no vitals filed for this visit.  Subjective Assessment - 07/13/19 0906    Subjective  Patient reports that he tried to do the VOR, but felt too uneasy to do the other exercises last week. Patient states that he felt imbalanced after last Friday's  session and that he did not feel he was back to normal until Tuesday. Patient reports that his cardiologist increased his blood pressure medication and that it has been 3 days since the increase in his dosage.    Pertinent History  History obtained form patient and medical record. Patient reports that he began to have issues with dizziness and imbalance in January of 2020. Patient reports that he saw Dr. Toma Castaneda in January 2020 and workup identified hypertension as possible cause of his symptoms. Patient was referred to cardiology. Patient states he was also diagnosed with sleep apnea and he now uses a CPAP machine. Patient reports that his dizziness symptoms began to worsen in January 2021. Patient saw Dr. Mindi Castaneda, neurologist on May 10, 2019 for sleep follow up. Patient had VNG testing on May 18, 2019 which revealed 57% right unilateral vestibular weakness, decreased pursuit and optokinetic gain and positive right BPPV per MR with subtle down-beating nystagmus present in head-hanging postion. Patient was treated for right BPPV after the VNG test. Patient reports initially this helped mildly reduce his symptoms but reports then he began to have worsening dizziness. Patient states he had a brain MRI two weeks ago that revealed no acute intracranial abnormality. Patient reports that he became dizzy when trying to get up after the MRI and patient states that the MRI technician could observe patient's eyes moving, describing nystagmus. Patient reports he is having vertigo describes as "swirling" sensation  and occasional unsteadiness that are motion provoked and positional in nature. Patient reports dizziness lasts a few minutes. Patient reports that he is getting episodes every time he lies down or sits up so multiple times per day. Patient reports that lying down, sitting up and rolling over brings on his dizziness. Patient reports that nothing makes his symptoms better that he knows of and he has not tried  Meclizine or antivert. Patient reports when he was a teenager that "the words mash together on the paper" when he would be reading and that he began to wear glasses. Patient reports he sometimes will get a "pulsing" headaches on center top, back or right side of the brain. Patient reports he gets these headaches infrequently and reports on average it occurs 1-2 times a month. Patient reports no history of migraines. Patient does report occasional visual changes where he sees "twinkling stars" and states these episodes are not associated with his headaches.    Diagnostic tests  MRI: no acute intracranial abnormality and VNG testing: 57% right unilateral vestibular weakness, decreased pursuit and optokinetic gain and positive right BPPV per MR with subtle down-beating nystagmus present in head-hanging postion    Patient Stated Goals  to be able to play with his daughter and to be able to asemble projects at home-like be able to build a bookcase; to have decreased dizziness       Neuromuscular Re-education: Patient reports 0/10 dizziness upon arrival.  VOR X 1 exercise:  Patient performed VOR X 1 in standing 2 reps horizontal and 1 rep of vertical 1 minute with verbal cue initially for technique.  Patient denies dizziness.  Ball tracking/follows: Patient performed in sitting and then in standing holding ball while moving ball horizontally and then vertically while tracking ball with head and eyes. Patient performed ball follows in sitting and then in standing while doing increasing clockwise and then decreasing counterclockwise turns while tracking ball with head and eyes. Patient performed ambulating 48' while doing ball follows horizontally and vertically several reps of each with CGA. Patient noted to have increased imbalance with trunk sway at times.  [name] toss to self:  Patient performed static sitting while tossing ball to self horizontal and then vertical while tracking ball with head and  eyes.  Patient denies dizziness with this activity.   Airex pad:  On Airex pad, patient performed feet together and semi-tandem progressions with alternating lead leg with and without horizontal and vertical head turns and then diagonal head turns with CGA.  Patient reports imbalance but denies dizziness. Patient with increased trunk sway noted.  Ambulation with head turns:  Patient performed 175' forwards ambulation while scanning for targets in hallway with CGA.  Patient reports mild imbalance but no dizziness.   Patient reports that the activities with vertical head turns are more challenging than horizontal head turns.  PT Education - 07/13/19 0907    Education Details  review VOR X 1 and added vertical VOR X 1; reviewed feet together and semi-tandem progressions with head turns which is on HEP    Person(s) Educated  Patient    Methods  Explanation;Demonstration;Verbal cues    Comprehension  Verbalized understanding;Returned demonstration       PT Short Term Goals - 06/22/19 0946      PT SHORT TERM GOAL #1   Title  Patient will report 50% or greater improvement in his symptoms of dizziness and imbalance with provoking motions or positions.    Time  4  Period  Weeks    Status  New    Target Date  07/20/19        PT Long Term Goals - 06/22/19 0946      PT LONG TERM GOAL #1   Title  Patient will report 80% or greater improvement in his symptoms of dizziness and imbalance with provoking motions or positions.    Time  12    Period  Weeks    Status  New    Target Date  09/21/19      PT LONG TERM GOAL #2   Title  Patient will report that he is able to resume his prior level of activity including being able to play with his daughter and to be able to build and assemble projects at home without episodes of vertigo.    Time  12    Period  Weeks    Status  New    Target Date  09/21/19      PT LONG TERM GOAL #3   Title  Patient will reduce perceived disability to low  levels as indicated by <40 on Dizziness Handicap Inventory to demonstrate significant improvement in dizziness.    Time  12    Period  Weeks    Status  New    Target Date  09/21/19      PT LONG TERM GOAL #4   Title  Patient will reduce falls risk as indicated by Activities Specific Balance Confidence Scale (ABC) >67%.    Time  12    Period  Weeks    Status  New    Target Date  09/21/19            Plan - 07/13/19 1053    Clinical Impression Statement  Patient reports that he felt imbalance for several days after last session, but states he has had no further episodes of vertigo. Patient with mild imbalance with ambulation with ball tracking exercise, ambulation while scanning for targets and activities on the Airex pad. Patient able to work on progression of vertical VOR X 1. Patient would benefit from continued PT services to further address functional deficits and goals.    Personal Factors and Comorbidities  Comorbidity 3+;Time since onset of injury/illness/exacerbation    Comorbidities  obstructive sleep apnea, HTN, herpes zoster    Examination-Activity Limitations  Bend;Bed Mobility;Other   lying down, sitting up and rolling over   Examination-Participation Restrictions  --   playing with his daughter and home projects like building/assembling items   Stability/Clinical Decision Making  Evolving/Moderate complexity    Rehab Potential  Excellent    PT Frequency  1x / week    PT Duration  12 weeks    PT Treatment/Interventions  Canalith Repostioning;Balance training;Neuromuscular re-education;Therapeutic exercise;Gait training;Stair training;Therapeutic activities;Patient/family education;Vestibular    PT Next Visit Plan  repeat right Dix-Hallpike test and perform EPley maneuver if indicated    Consulted and Agree with Plan of Care  Patient       Patient will benefit from skilled therapeutic intervention in order to improve the following deficits and impairments:  Decreased  balance, Dizziness  Visit Diagnosis: Dizziness and giddiness  BPPV (benign paroxysmal positional vertigo), right     Problem List There are no problems to display for this patient.  Mardelle Matte PT, DPT [phone] Mardelle Matte 07/13/2019, 11:32 AM  [place] [address] [place], [place], [address] Phone: [phone]   Fax:  [phone]  Name: Claretta Fraise  MRN: [id number] Date of Birth: 15-Feb-1986

## 2019-07-20 ENCOUNTER — Other Ambulatory Visit: Payer: Self-pay

## 2019-07-20 ENCOUNTER — Encounter: Payer: Self-pay | Admitting: Physical Therapy

## 2019-07-20 ENCOUNTER — Ambulatory Visit: Payer: BC Managed Care – PPO | Admitting: Physical Therapy

## 2019-07-20 DIAGNOSIS — R42 Dizziness and giddiness: Secondary | ICD-10-CM | POA: Diagnosis not present

## 2019-07-20 DIAGNOSIS — H8111 Benign paroxysmal vertigo, right ear: Secondary | ICD-10-CM

## 2019-07-20 NOTE — Therapy (Deleted)
[facility] [address] [place], [place], [address] Phone: [phone]   Fax:  [phone]  Physical Therapy Treatment  Patient Details  Name: Erik Castaneda MRN: [id number] Date of Birth: [birth year] Referring Provider (PT): [name]   Encounter Date: 07/20/2019  PT End of Session - 07/20/19 0903    Visit Number  5    Number of Visits  13    Date for PT Re-Evaluation  09/21/19    PT Start Time  0902    Equipment Utilized During Treatment  Gait belt    Activity Tolerance  Patient tolerated treatment well    Behavior During Therapy  [facility] for tasks assessed/performed       Past Medical History:  Diagnosis Date  . Complication of anesthesia    slow to wake up and oxygen level dropped  . GERD (gastroesophageal reflux disease)     Past Surgical History:  Procedure Laterality Date  . ENDOSCOPIC CONCHA BULLOSA RESECTION Bilateral 03/11/2016   Procedure: ENDOSCOPIC CONCHA BULLOSA RESECTION;  Surgeon: [name], MD;  Location: [facility];  Service: ENT;  Laterality: Bilateral;  . LAPAROSCOPIC NISSEN FUNDOPLICATION    . SEPTOPLASTY N/A 03/11/2016   Procedure: SEPTOPLASTY;  Surgeon: [name], MD;  Location: [facility];  Service: ENT;  Laterality: N/A;  . TONSILLECTOMY N/A 03/11/2016   Procedure: TONSILLECTOMY;  Surgeon: [name], MD;  Location: [facility];  Service: ENT;  Laterality: N/A;  . TURBINATE REDUCTION Bilateral 03/11/2016   Procedure: PARTIAL TURBINATE REDUCTION;  Surgeon: [name], MD;  Location: [facility];  Service: ENT;  Laterality: Bilateral;    There were no vitals filed for this visit.  Subjective Assessment - 07/20/19 0903    Subjective  Patient    Pertinent History  History obtained form patient and medical record. Patient reports that he began to have issues with dizziness and imbalance in January of 2020. Patient reports that he saw [name] in  January 2020 and workup identified hypertension as possible cause of his symptoms. Patient was referred to cardiology. Patient states he was also diagnosed with sleep apnea and he now uses a CPAP machine. Patient reports that his dizziness symptoms began to worsen in January 2021. Patient saw [name], neurologist on May 10, 2019 for sleep follow up. Patient had VNG testing on May 18, 2019 which revealed 57% right unilateral vestibular weakness, decreased pursuit and optokinetic gain and positive right BPPV per MR with subtle down-beating nystagmus present in head-hanging postion. Patient was treated for right BPPV after the VNG test. Patient reports initially this helped mildly reduce his symptoms but reports then he began to have worsening dizziness. Patient states he had a brain MRI two weeks ago that revealed no acute intracranial abnormality. Patient reports that he became dizzy when trying to get up after the MRI and patient states that the MRI technician could observe patient's eyes moving, describing nystagmus. Patient reports he is having vertigo describes as "swirling" sensation and occasional unsteadiness that are motion provoked and positional in nature. Patient reports dizziness lasts a few minutes. Patient reports that he is getting episodes every time he lies down or sits up so multiple times per day. Patient reports that lying down, sitting up and rolling over brings on his dizziness. Patient reports that nothing makes his symptoms better that he knows of and he has not tried Meclizine or antivert. Patient reports when he was a teenager that "the words mash  together on the paper" when he would be reading and that he began to wear glasses. Patient reports he sometimes will get a "pulsing" headaches on center top, back or right side of the brain. Patient reports he gets these headaches infrequently and reports on average it occurs 1-2 times a month. Patient reports no history of migraines.  Patient does report occasional visual changes where he sees "twinkling stars" and states these episodes are not associated with his headaches.    Diagnostic tests  MRI: no acute intracranial abnormality and VNG testing: 57% right unilateral vestibular weakness, decreased pursuit and optokinetic gain and positive right BPPV per MR with subtle down-beating nystagmus present in head-hanging postion    Patient Stated Goals  to be able to play with his daughter and to be able to asemble projects at home-like be able to build a bookcase; to have decreased dizziness       Patient reports that he did not have any off kilter days. Patient states that when he bends down to get things it is not as bad as it used to be but states he still has some feeling going on. Patient reports no episodes of vertigo.         PT Education - 07/20/19 0903    Education Details  Patient    Person(s) Educated  Patient    Methods  Explanation    Comprehension  Verbalized understanding       PT Short Term Goals - 06/22/19 0946      PT SHORT TERM GOAL #1   Title  Patient will report 50% or greater improvement in his symptoms of dizziness and imbalance with provoking motions or positions.    Time  4    Period  Weeks    Status  New    Target Date  07/20/19        PT Long Term Goals - 06/22/19 0946      PT LONG TERM GOAL #1   Title  Patient will report 80% or greater improvement in his symptoms of dizziness and imbalance with provoking motions or positions.    Time  12    Period  Weeks    Status  New    Target Date  09/21/19      PT LONG TERM GOAL #2   Title  Patient will report that he is able to resume his prior level of activity including being able to play with his daughter and to be able to build and assemble projects at home without episodes of vertigo.    Time  12    Period  Weeks    Status  New    Target Date  09/21/19      PT LONG TERM GOAL #3   Title  Patient will reduce perceived disability  to low levels as indicated by <40 on Dizziness Handicap Inventory to demonstrate significant improvement in dizziness.    Time  12    Period  Weeks    Status  New    Target Date  09/21/19      PT LONG TERM GOAL #4   Title  Patient will reduce falls risk as indicated by Activities Specific Balance Confidence Scale (ABC) >67%.    Time  12    Period  Weeks    Status  New    Target Date  09/21/19              Patient will benefit from skilled therapeutic intervention in  order to improve the following deficits and impairments:     Visit Diagnosis: Dizziness and giddiness  BPPV (benign paroxysmal positional vertigo), right     Problem List There are no problems to display for this patient.   Mardelle Matte 07/20/2019, 9:04 AM  [place] [facility] [address] [place], [place], [address] Phone: [phone]   Fax:  [phone]  Name: Erik Castaneda MRN: [id number] Date of Birth: 1986-02-20

## 2019-07-20 NOTE — Therapy (Signed)
[name] [address]. [place], [place], [address] Phone: [phone]   Fax:  [phone]  Physical Therapy Treatment  Patient Details  Name: Erik Castaneda MRN: [id number] Date of Birth: [birth year] Referring Provider (PT): [name]   Encounter Date: 07/20/2019  PT End of Session - 07/20/19 0903    Visit Number  5    Number of Visits  13    Date for PT Re-Evaluation  09/21/19    PT Start Time  0902    PT Stop Time  0945    PT Time Calculation (min)  43 min    Equipment Utilized During Treatment  Gait belt    Activity Tolerance  Patient tolerated treatment well    Behavior During Therapy  [facility] for tasks assessed/performed       Past Medical History:  Diagnosis Date  . Complication of anesthesia    slow to wake up and oxygen level dropped  . GERD (gastroesophageal reflux disease)     Past Surgical History:  Procedure Laterality Date  . ENDOSCOPIC CONCHA BULLOSA RESECTION Bilateral 03/11/2016   Procedure: ENDOSCOPIC CONCHA BULLOSA RESECTION;  Surgeon: [name], MD;  Location: [facility];  Service: ENT;  Laterality: Bilateral;  . LAPAROSCOPIC NISSEN FUNDOPLICATION    . SEPTOPLASTY N/A 03/11/2016   Procedure: SEPTOPLASTY;  Surgeon: [name], MD;  Location: [facility];  Service: ENT;  Laterality: N/A;  . TONSILLECTOMY N/A 03/11/2016   Procedure: TONSILLECTOMY;  Surgeon: [name], MD;  Location: [facility];  Service: ENT;  Laterality: N/A;  . TURBINATE REDUCTION Bilateral 03/11/2016   Procedure: PARTIAL TURBINATE REDUCTION;  Surgeon: [name], MD;  Location: [facility];  Service: ENT;  Laterality: Bilateral;    There were no vitals filed for this visit.  Subjective Assessment - 07/20/19 0903    Subjective  Patient reports that he did not have any off kilter days this past week. Patient states that when he bends down to get things it is not as bad as it used to be  but states he still has some feeling going on. Patient reports no episodes of vertigo.    Pertinent History  History obtained form patient and medical record. Patient reports that he began to have issues with dizziness and imbalance in January of 2020. Patient reports that he saw [name] in January 2020 and workup identified hypertension as possible cause of his symptoms. Patient was referred to cardiology. Patient states he was also diagnosed with sleep apnea and he now uses a CPAP machine. Patient reports that his dizziness symptoms began to worsen in January 2021. Patient saw [name], neurologist on May 10, 2019 for sleep follow up. Patient had VNG testing on May 18, 2019 which revealed 57% right unilateral vestibular weakness, decreased pursuit and optokinetic gain and positive right BPPV per MR with subtle down-beating nystagmus present in head-hanging postion. Patient was treated for right BPPV after the VNG test. Patient reports initially this helped mildly reduce his symptoms but reports then he began to have worsening dizziness. Patient states he had a brain MRI two weeks ago that revealed no acute intracranial abnormality. Patient reports that he became dizzy when trying to get up after the MRI and patient states that the MRI technician could observe patient's eyes moving, describing nystagmus. Patient reports he is having vertigo describes as "swirling" sensation and occasional unsteadiness that are motion provoked and positional in nature. Patient reports dizziness lasts a few  minutes. Patient reports that he is getting episodes every time he lies down or sits up so multiple times per day. Patient reports that lying down, sitting up and rolling over brings on his dizziness. Patient reports that nothing makes his symptoms better that he knows of and he has not tried Meclizine or antivert. Patient reports when he was a teenager that "the words mash together on the paper" when he would be  reading and that he began to wear glasses. Patient reports he sometimes will get a "pulsing" headaches on center top, back or right side of the brain. Patient reports he gets these headaches infrequently and reports on average it occurs 1-2 times a month. Patient reports no history of migraines. Patient does report occasional visual changes where he sees "twinkling stars" and states these episodes are not associated with his headaches.    Diagnostic tests  MRI: no acute intracranial abnormality and VNG testing: 57% right unilateral vestibular weakness, decreased pursuit and optokinetic gain and positive right BPPV per MR with subtle down-beating nystagmus present in head-hanging postion    Patient Stated Goals  to be able to play with his daughter and to be able to asemble projects at home-like be able to build a bookcase; to have decreased dizziness       Neuromuscular Re-education:  VOR X 1 exercise:  Patient performed VOR X 1 horizontal in standing with conflicting background 2 reps of 1 minute each and then 2 reps of 1 minute each vertical VOR X 1 with verbal cues for head speed.   Patient denies dizziness with this activity, but reports blurring when attempts to increase head speed.   Newman Pies toss to self:  Patient performed amb 175' trials of forward ambulation while tossing ball to self horizontal and then vertical while tracking ball with head and eyes with CGA.  Patient reports imbalance with this activity and had a few episodes of increased lateral sway that patient was able to self-correct.   Newman Pies toss over shoulder: Patient performed multiple 175' trials of forward and retro ambulation while tossing ball over one shoulder with return catch over opposite shoulder with CGA.  Patient reports increase in imbalance with this activity.  Airex balance beam: On Airex balance beam, performed sideways stance static holds with horizontal, vertical and diagonal head turns.  On Airex balance beam,  performed sideways stepping with horizontal head turns 5' times 4 reps. On Airex balance beam, performed sideways stepping with vertical head turns 5' times 4 reps. Patient challenged by diagonal head turns and by sideways stepping with head turning activities with increased sway but pt able to self-correct.     PT Education - 07/20/19 0903    Education Details  added conflicting background progression to vertical and horizontal VOR X1 exercise and standing starburst pattern arm reach with head and eye follows to HEP    Person(s) Educated  Patient    Methods  Explanation;Handout;Demonstration;Verbal cues    Comprehension  Verbalized understanding;Returned demonstration       PT Short Term Goals - 06/22/19 0946      PT SHORT TERM GOAL #1   Title  Patient will report 50% or greater improvement in his symptoms of dizziness and imbalance with provoking motions or positions.    Time  4    Period  Weeks    Status  New    Target Date  07/20/19        PT Long Term Goals - 07/20/19 1445  PT LONG TERM GOAL #1   Title  Patient will report 80% or greater improvement in his symptoms of dizziness and imbalance with provoking motions or positions.    Time  12    Period  Weeks    Status  New      PT LONG TERM GOAL #2   Title  Patient will report that he is able to resume his prior level of activity including being able to play with his daughter and to be able to build and assemble projects at home without episodes of vertigo.    Time  12    Period  Weeks    Status  Achieved      PT LONG TERM GOAL #3   Title  Patient will reduce perceived disability to low levels as indicated by <40 on Dizziness Handicap Inventory to demonstrate significant improvement in dizziness.    Time  12    Period  Weeks    Status  New      PT LONG TERM GOAL #4   Title  Patient will reduce falls risk as indicated by Activities Specific Balance Confidence Scale (ABC) >67%.    Time  12    Period  Weeks     Status  New            Plan - 07/20/19 1434    Clinical Impression Statement  Patient able to work on progressions of VOR with conflicting background and focused on increasing head speed. Patient with imbalance and increased sway with ambulation with ball toss and sidestepping on Airex balance beam with head turns. Patient denies vertigo and dizziness throughout session, but does demonstrate increased sway and imbalance with high level vestibular balance exercises. Patient progressing well, but would benefit from continued PT services to work on progressions to try to improve balance and decrease deficits.    Personal Factors and Comorbidities  Comorbidity 3+;Time since onset of injury/illness/exacerbation    Comorbidities  obstructive sleep apnea, HTN, herpes zoster    Examination-Activity Limitations  Bend;Bed Mobility;Other   lying down, sitting up and rolling over   Examination-Participation Restrictions  --   playing with his daughter and home projects like building/assembling items   Stability/Clinical Decision Making  Evolving/Moderate complexity    Rehab Potential  Excellent    PT Frequency  1x / week    PT Duration  12 weeks    PT Treatment/Interventions  Canalith Repostioning;Balance training;Neuromuscular re-education;Therapeutic exercise;Gait training;Stair training;Therapeutic activities;Patient/family education;Vestibular    PT Next Visit Plan  repeat right Dix-Hallpike test and perform EPley maneuver if indicated    Consulted and Agree with Plan of Care  Patient       Patient will benefit from skilled therapeutic intervention in order to improve the following deficits and impairments:  Decreased balance, Dizziness  Visit Diagnosis: Dizziness and giddiness  BPPV (benign paroxysmal positional vertigo), right     Problem List There are no problems to display for this patient.   Lady Deutscher PT, DPT [phone] Lady Deutscher 07/20/2019, 2:52 PM  [facility] [address] [place], [place], [address] Phone: [phone]   Fax:  [phone]  Name: Gurfateh Mcclain MRN: [id number] Date of Birth: 1985/08/24

## 2019-07-27 ENCOUNTER — Encounter: Payer: Self-pay | Admitting: Physical Therapy

## 2019-07-27 ENCOUNTER — Other Ambulatory Visit: Payer: Self-pay

## 2019-07-27 ENCOUNTER — Ambulatory Visit: Payer: BC Managed Care – PPO | Admitting: Physical Therapy

## 2019-07-27 DIAGNOSIS — H8111 Benign paroxysmal vertigo, right ear: Secondary | ICD-10-CM

## 2019-07-27 DIAGNOSIS — R42 Dizziness and giddiness: Secondary | ICD-10-CM

## 2019-07-27 NOTE — Therapy (Signed)
[facility] [address] [place], [place], [address] Phone: [phone]   Fax:  [phone]  Physical Therapy Treatment  Patient Details  Name: Erik Castaneda MRN: [id number] Date of Birth: 04-Sep-1985 Referring Provider (PT): [name]   Encounter Date: 07/27/2019  PT End of Session - 07/27/19 0958    Visit Number  6    Number of Visits  13    Date for PT Re-Evaluation  09/21/19    PT Start Time  0901    PT Stop Time  0942    PT Time Calculation (min)  41 min    Equipment Utilized During Treatment  Gait belt    Activity Tolerance  Patient tolerated treatment well    Behavior During Therapy  [facility] for tasks assessed/performed       Past Medical History:  Diagnosis Date  . Complication of anesthesia    slow to wake up and oxygen level dropped  . GERD (gastroesophageal reflux disease)     Past Surgical History:  Procedure Laterality Date  . ENDOSCOPIC CONCHA BULLOSA RESECTION Bilateral 03/11/2016   Procedure: ENDOSCOPIC CONCHA BULLOSA RESECTION;  Surgeon: [name], MD;  Location: [facility];  Service: ENT;  Laterality: Bilateral;  . LAPAROSCOPIC NISSEN FUNDOPLICATION    . SEPTOPLASTY N/A 03/11/2016   Procedure: SEPTOPLASTY;  Surgeon: [name], MD;  Location: [facility];  Service: ENT;  Laterality: N/A;  . TONSILLECTOMY N/A 03/11/2016   Procedure: TONSILLECTOMY;  Surgeon: [name], MD;  Location: [facility];  Service: ENT;  Laterality: N/A;  . TURBINATE REDUCTION Bilateral 03/11/2016   Procedure: PARTIAL TURBINATE REDUCTION;  Surgeon: [name], MD;  Location: [facility];  Service: ENT;  Laterality: Bilateral;    There were no vitals filed for this visit.  Subjective Assessment - 07/27/19 0904    Subjective  Patient states he had a good past week. Patient states that he has been practicing walking with head turns in the hallways when he is at school.    Pertinent  History  History obtained form patient and medical record. Patient reports that he began to have issues with dizziness and imbalance in January of 2020. Patient reports that he saw [name] in January 2020 and workup identified hypertension as possible cause of his symptoms. Patient was referred to cardiology. Patient states he was also diagnosed with sleep apnea and he now uses a CPAP machine. Patient reports that his dizziness symptoms began to worsen in January 2021. Patient saw [name], neurologist on May 10, 2019 for sleep follow up. Patient had VNG testing on May 18, 2019 which revealed 57% right unilateral vestibular weakness, decreased pursuit and optokinetic gain and positive right BPPV per MR with subtle down-beating nystagmus present in head-hanging postion. Patient was treated for right BPPV after the VNG test. Patient reports initially this helped mildly reduce his symptoms but reports then he began to have worsening dizziness. Patient states he had a brain MRI two weeks ago that revealed no acute intracranial abnormality. Patient reports that he became dizzy when trying to get up after the MRI and patient states that the MRI technician could observe patient's eyes moving, describing nystagmus. Patient reports he is having vertigo describes as "swirling" sensation and occasional unsteadiness that are motion provoked and positional in nature. Patient reports dizziness lasts a few minutes. Patient reports that he is getting episodes every time he lies down or sits up so multiple times per day. Patient  reports that lying down, sitting up and rolling over brings on his dizziness. Patient reports that nothing makes his symptoms better that he knows of and he has not tried Meclizine or antivert. Patient reports when he was a teenager that "the words mash together on the paper" when he would be reading and that he began to wear glasses. Patient reports he sometimes will get a "pulsing" headaches  on center top, back or right side of the brain. Patient reports he gets these headaches infrequently and reports on average it occurs 1-2 times a month. Patient reports no history of migraines. Patient does report occasional visual changes where he sees "twinkling stars" and states these episodes are not associated with his headaches.    Diagnostic tests  MRI: no acute intracranial abnormality and VNG testing: 57% right unilateral vestibular weakness, decreased pursuit and optokinetic gain and positive right BPPV per MR with subtle down-beating nystagmus present in head-hanging postion    Patient Stated Goals  to be able to play with his daughter and to be able to asemble projects at home-like be able to build a bookcase; to have decreased dizziness       Neuromuscular Re-education:  VOR X 1 exercise:  Patient performed VOR X 1 horizontal with conflicting background while ambulating towards target about 50' distance 3 reps of 1 minute each.  Patient denies dizziness with this activity and did not veer when walking.   VOR x 2 exercise:  Demonstrated and educated as to VOR X 2. Patient performed VOR X 2 horiz in sitting 3 reps of 1 minute each with verbal cues for technique.  Patient reports that it is difficult for him to concentrate and to focus on the target especially when the target is moving towards the right field of his vision. Denies dizziness. Added this exercise to HEP.   Body Wall Rolls:  Patient performed 5 reps of supported, body wall rolls with eyes open.  Patient reports 5/10 difficulty with keeping his eyes focused while doing this activity. Added to HEP  Hallway ball toss:  In hallway, worked on ball toss against one wall with alternating quick turns to toss ball against opposite wall while tracking with eyes and head with verbal cue to hold ball up more towards head level to track. Patient denies dizziness but reports that it is difficult to concentrate with this  activity.  Ball toss over shoulder: Patient performed multiple 100' trial  retro ambulation while tossing ball over one shoulder with return catch over opposite shoulder with CGA. Patient denies dizziness and had no imbalance noted with this activity. Patient performed multiple 150' trials of forward and retro ambulation while tossing ball over one shoulder with return catch over opposite shoulder varying the ball position to head, shoulder and waist level to promote head turning and tilting with CGA. Patient did not have any imbalance or wobbles with this activity. Patient denied dizziness.   Ball tracking : Patient performed walking while tossing ball horizontally while tracking ball with head and eyes with verbal cues to turn the head with CGA. Patient reports no dizziness with this activity.     [employer] Access Code [facility]   PT Education - 07/27/19 0957    Education Details  discussed walking VOR X1 progression and added body wall rolls and VOR X 2 in sitting 1 minute reps horizontal to HEP    Person(s) Educated  Patient    Methods  Explanation;Demonstration;Handout;Verbal cues    Comprehension  Verbalized understanding;Returned  demonstration       PT Short Term Goals - 06/22/19 0946      PT SHORT TERM GOAL #1   Title  Patient will report 50% or greater improvement in his symptoms of dizziness and imbalance with provoking motions or positions.    Time  4    Period  Weeks    Status  New    Target Date  07/20/19        PT Long Term Goals - 07/20/19 1445      PT LONG TERM GOAL #1   Title  Patient will report 80% or greater improvement in his symptoms of dizziness and imbalance with provoking motions or positions.    Time  12    Period  Weeks    Status  New      PT LONG TERM GOAL #2   Title  Patient will report that he is able to resume his prior level of activity including being able to play with his daughter and to be able to build and assemble projects at home  without episodes of vertigo.    Time  12    Period  Weeks    Status  Achieved      PT LONG TERM GOAL #3   Title  Patient will reduce perceived disability to low levels as indicated by <40 on Dizziness Handicap Inventory to demonstrate significant improvement in dizziness.    Time  12    Period  Weeks    Status  New      PT LONG TERM GOAL #4   Title  Patient will reduce falls risk as indicated by Activities Specific Balance Confidence Scale (ABC) >67%.    Time  12    Period  Weeks    Status  New            Plan - 07/27/19 1000    Clinical Impression Statement  Patient making good progress with therapy and reports compliance with HEP. Patient had no dizziness or imbalance with activties in the clinic this date and demonstrated good eye tracking with activities, but patient did report some diffuclty with focusing his eyes especially to right sided targets and difficulty with concentrating during some of the newer activities. Patient was able to progress to walking VOR X1 with conflicting background and added supported body wall rolls and seated VOR X2  horizontal exercises to HEP. Patient reports that horizontal head turning is more challenging than vertical head turning. Will plan on trying activities movement/ambulation with alteranting quick turns and reviewing HEP and adding progressions as able next session. Patient would benefit from continued PT services to further address goals and deficits. Anticipating repeating outcome measures within the next visit or 2 to further assess goals.    Personal Factors and Comorbidities  Comorbidity 3+;Time since onset of injury/illness/exacerbation    Comorbidities  obstructive sleep apnea, HTN, herpes zoster    Examination-Activity Limitations  Bend;Bed Mobility;Other   lying down, sitting up and rolling over   Examination-Participation Restrictions  --   playing with his daughter and home projects like building/assembling items    Stability/Clinical Decision Making  Evolving/Moderate complexity    Rehab Potential  Excellent    PT Frequency  1x / week    PT Duration  12 weeks    PT Treatment/Interventions  Canalith Repostioning;Balance training;Neuromuscular re-education;Therapeutic exercise;Gait training;Stair training;Therapeutic activities;Patient/family education;Vestibular    PT Next Visit Plan  repeat right Dix-Hallpike test and perform EPley maneuver if indicated  PT Home Exercise Plan  Medbridge Access Code [email or web address]    Consulted and Agree with Plan of Care  Patient       Patient will benefit from skilled therapeutic intervention in order to improve the following deficits and impairments:  Decreased balance, Dizziness  Visit Diagnosis: Dizziness and giddiness  BPPV (benign paroxysmal positional vertigo), right     Problem List There are no problems to display for this patient.  Mardelle Matte PT, DPT [phone] Mardelle Matte 07/27/2019, 10:21 AM  [place] [facility] [address] [place], [place], [address] Phone: [phone]   Fax:  [phone]  Name: Bowe Sidor MRN: [id number] Date of Birth: 1986-03-08

## 2019-08-03 ENCOUNTER — Encounter: Payer: Self-pay | Admitting: Physical Therapy

## 2019-08-03 ENCOUNTER — Ambulatory Visit: Payer: BC Managed Care – PPO | Attending: Otolaryngology | Admitting: Physical Therapy

## 2019-08-03 DIAGNOSIS — H8111 Benign paroxysmal vertigo, right ear: Secondary | ICD-10-CM | POA: Insufficient documentation

## 2019-08-03 DIAGNOSIS — R42 Dizziness and giddiness: Secondary | ICD-10-CM | POA: Insufficient documentation

## 2019-08-03 NOTE — Therapy (Signed)
[facility] [address]. [place], [place], [address] Phone: [phone]   Fax:  [phone]  Physical Therapy Treatment  Patient Details  Name: Erik Castaneda MRN: [id number] Date of Birth: 09-24-1985 Referring Provider (PT): [name]   Encounter Date: 08/03/2019  PT End of Session - 08/03/19 1141    Visit Number  7    Number of Visits  13    Date for PT Re-Evaluation  09/21/19    PT Start Time  0901    PT Stop Time  0943    PT Time Calculation (min)  42 min    Equipment Utilized During Treatment  Gait belt    Activity Tolerance  Patient tolerated treatment well    Behavior During Therapy  [facility] for tasks assessed/performed       Past Medical History:  Diagnosis Date  . Complication of anesthesia    slow to wake up and oxygen level dropped  . GERD (gastroesophageal reflux disease)     Past Surgical History:  Procedure Laterality Date  . ENDOSCOPIC CONCHA BULLOSA RESECTION Bilateral 03/11/2016   Procedure: ENDOSCOPIC CONCHA BULLOSA RESECTION;  Surgeon: [name], MD;  Location: [facility];  Service: ENT;  Laterality: Bilateral;  . LAPAROSCOPIC NISSEN FUNDOPLICATION    . SEPTOPLASTY N/A 03/11/2016   Procedure: SEPTOPLASTY;  Surgeon: [name], MD;  Location: [facility];  Service: ENT;  Laterality: N/A;  . TONSILLECTOMY N/A 03/11/2016   Procedure: TONSILLECTOMY;  Surgeon: [name], MD;  Location: [facility];  Service: ENT;  Laterality: N/A;  . TURBINATE REDUCTION Bilateral 03/11/2016   Procedure: PARTIAL TURBINATE REDUCTION;  Surgeon: [name], MD;  Location: [facility];  Service: ENT;  Laterality: Bilateral;    There were no vitals filed for this visit.  Subjective Assessment - 08/03/19 0902    Subjective  Patient states he had the second Covid vaccine and was laid up for a day and a half. Patient states the first half of the week was a wash and states he was not  doing much due to the vaccine. Before he got the vaccine he said he was doing all right but not 100% and states he did one quick turn and felt unsteady.    Pertinent History  History obtained form patient and medical record. Patient reports that he began to have issues with dizziness and imbalance in January of 2020. Patient reports that he saw [name] in January 2020 and workup identified hypertension as possible cause of his symptoms. Patient was referred to cardiology. Patient states he was also diagnosed with sleep apnea and he now uses a CPAP machine. Patient reports that his dizziness symptoms began to worsen in January 2021. Patient saw [name], neurologist on May 10, 2019 for sleep follow up. Patient had VNG testing on May 18, 2019 which revealed 57% right unilateral vestibular weakness, decreased pursuit and optokinetic gain and positive right BPPV per MR with subtle down-beating nystagmus present in head-hanging postion. Patient was treated for right BPPV after the VNG test. Patient reports initially this helped mildly reduce his symptoms but reports then he began to have worsening dizziness. Patient states he had a brain MRI two weeks ago that revealed no acute intracranial abnormality. Patient reports that he became dizzy when trying to get up after the MRI and patient states that the MRI technician could observe patient's eyes moving, describing nystagmus. Patient reports he is having vertigo describes as "swirling" sensation and  occasional unsteadiness that are motion provoked and positional in nature. Patient reports dizziness lasts a few minutes. Patient reports that he is getting episodes every time he lies down or sits up so multiple times per day. Patient reports that lying down, sitting up and rolling over brings on his dizziness. Patient reports that nothing makes his symptoms better that he knows of and he has not tried Meclizine or antivert. Patient reports when he was a  teenager that "the words mash together on the paper" when he would be reading and that he began to wear glasses. Patient reports he sometimes will get a "pulsing" headaches on center top, back or right side of the brain. Patient reports he gets these headaches infrequently and reports on average it occurs 1-2 times a month. Patient reports no history of migraines. Patient does report occasional visual changes where he sees "twinkling stars" and states these episodes are not associated with his headaches.    Diagnostic tests  MRI: no acute intracranial abnormality and VNG testing: 57% right unilateral vestibular weakness, decreased pursuit and optokinetic gain and positive right BPPV per MR with subtle down-beating nystagmus present in head-hanging postion    Patient Stated Goals  to be able to play with his daughter and to be able to asemble projects at home-like be able to build a bookcase; to have decreased dizziness       Neuromuscular Re-education:  Body Wall Rolls:  Patient performed 1 rep of supported, body wall rolls with eyes open and then 6 reps of supported, body wall rolls with eyes closed with CGA.  Patient reports increased dizziness with this activity. Patient had a short standing rest break after this activity.  Patient performed 1 rep of ambulating about 125' with horizontal head turns with CGA. Patient without veering and reported no dizziness with this activity. Patient performed forward and then retro ambulation of reaching down to retrieve cones off the floor by turning 45 degrees to face the cone and then doing a 90 degree quick turn to turn and place cone. Patient reported that this increased patient's dizziness 4/10 and he had more difficulty with retro ambulation.    VOR x 2 exercise:  Demonstrated and educated as to VOR X 2. Patient performed VOR X 2 horiz in standing 2 reps of 1 minute each demonstrating good technique. Patient reports 7/10 dizziness in standing. Patient  had a sitting rest break and then performed 1 rep of 1 miute in sitting.      PT Education - 08/03/19 1140    Education Details  added VOR X 2 in standing and body wall rolls working on increasing speed and can do eyes closed wall supported wall turns if he has someone that can spot him for safety progressions for HEP    Person(s) Educated  Patient    Methods  Explanation    Comprehension  Verbalized understanding       PT Short Term Goals - 06/22/19 0946      PT SHORT TERM GOAL #1   Title  Patient will report 50% or greater improvement in his symptoms of dizziness and imbalance with provoking motions or positions.    Time  4    Period  Weeks    Status  New    Target Date  07/20/19        PT Long Term Goals - 07/20/19 1445      PT LONG TERM GOAL #1   Title  Patient will report 80%  or greater improvement in his symptoms of dizziness and imbalance with provoking motions or positions.    Time  12    Period  Weeks    Status  New      PT LONG TERM GOAL #2   Title  Patient will report that he is able to resume his prior level of activity including being able to play with his daughter and to be able to build and assemble projects at home without episodes of vertigo.    Time  12    Period  Weeks    Status  Achieved      PT LONG TERM GOAL #3   Title  Patient will reduce perceived disability to low levels as indicated by <40 on Dizziness Handicap Inventory to demonstrate significant improvement in dizziness.    Time  12    Period  Weeks    Status  New      PT LONG TERM GOAL #4   Title  Patient will reduce falls risk as indicated by Activities Specific Balance Confidence Scale (ABC) >67%.    Time  12    Period  Weeks    Status  New            Plan - 08/03/19 1142    Clinical Impression Statement  Patient reports that he had his second Covid vaccination earlier in the week and that he felt poorly for several days afterwards. Patient was motivated to session. Focused on  activities with quick turns this session and this recreated patient's symtpoms. Will plan on continuing to work on quick turn activities per patient tolerance next session. Patient able to work on progressions of VOR X2 in standing and supported body wall rolls with eyes closed. Patient would benefit from continued PT services to further address functional deficits and goals as set on plan of care.    Personal Factors and Comorbidities  Comorbidity 3+;Time since onset of injury/illness/exacerbation    Comorbidities  obstructive sleep apnea, HTN, herpes zoster    Examination-Activity Limitations  Bend;Bed Mobility;Other   lying down, sitting up and rolling over   Examination-Participation Restrictions  --   playing with his daughter and home projects like building/assembling items   Stability/Clinical Decision Making  Evolving/Moderate complexity    Rehab Potential  Excellent    PT Frequency  1x / week    PT Duration  12 weeks    PT Treatment/Interventions  Canalith Repostioning;Balance training;Neuromuscular re-education;Therapeutic exercise;Gait training;Stair training;Therapeutic activities;Patient/family education;Vestibular    PT Next Visit Plan  repeat right Dix-Hallpike test and perform EPley maneuver if indicated    PT Home Exercise Plan  Medbridge Access Code [email or web address]    Consulted and Agree with Plan of Care  Patient       Patient will benefit from skilled therapeutic intervention in order to improve the following deficits and impairments:  Decreased balance, Dizziness  Visit Diagnosis: Dizziness and giddiness  BPPV (benign paroxysmal positional vertigo), right     Problem List There are no problems to display for this patient.  Lady Deutscher PT, DPT [phone] Lady Deutscher 08/03/2019, 11:56 AM  [facility] [address] [place], [place], [address] Phone: [phone]   Fax:  [phone]  Name: Erik Castaneda MRN:  [id number] Date of Birth: October 07, 1985

## 2019-08-10 ENCOUNTER — Encounter: Payer: Self-pay | Admitting: Physical Therapy

## 2019-08-10 ENCOUNTER — Ambulatory Visit: Payer: BC Managed Care – PPO | Admitting: Physical Therapy

## 2019-08-10 DIAGNOSIS — R42 Dizziness and giddiness: Secondary | ICD-10-CM | POA: Diagnosis not present

## 2019-08-10 DIAGNOSIS — H8111 Benign paroxysmal vertigo, right ear: Secondary | ICD-10-CM

## 2019-08-10 NOTE — Therapy (Signed)
[facility] [address] [place], [place], [address] Phone: [phone]   Fax:  [phone]  Physical Therapy Treatment  Patient Details  Name: Erik Castaneda MRN: [id number] Date of Birth: November 08, 1985 Referring Provider (PT): [name]   Encounter Date: 08/10/2019  PT End of Session - 08/10/19 1216    Visit Number  8    Number of Visits  13    Date for PT Re-Evaluation  09/21/19    PT Start Time  0903    PT Stop Time  0944    PT Time Calculation (min)  41 min    Equipment Utilized During Treatment  Gait belt    Activity Tolerance  Patient tolerated treatment well    Behavior During Therapy  [facility] for tasks assessed/performed       Past Medical History:  Diagnosis Date  . Complication of anesthesia    slow to wake up and oxygen level dropped  . GERD (gastroesophageal reflux disease)     Past Surgical History:  Procedure Laterality Date  . ENDOSCOPIC CONCHA BULLOSA RESECTION Bilateral 03/11/2016   Procedure: ENDOSCOPIC CONCHA BULLOSA RESECTION;  Surgeon: [name], MD;  Location: [facility];  Service: ENT;  Laterality: Bilateral;  . LAPAROSCOPIC NISSEN FUNDOPLICATION    . SEPTOPLASTY N/A 03/11/2016   Procedure: SEPTOPLASTY;  Surgeon: [name], MD;  Location: [facility];  Service: ENT;  Laterality: N/A;  . TONSILLECTOMY N/A 03/11/2016   Procedure: TONSILLECTOMY;  Surgeon: [name], MD;  Location[facility];  Service: ENT;  Laterality: N/A;  . TURBINATE REDUCTION Bilateral 03/11/2016   Procedure: PARTIAL TURBINATE REDUCTION;  Surgeon: [name], MD;  Location: [facility];  Service: ENT;  Laterality: Bilateral;    There were no vitals filed for this visit.  Subjective Assessment - 08/10/19 0906    Subjective  Patient states his symptoms have been pretty good. He said he did quite a bit of bending over and states that feels weird and he got a slight feeling of  dizziness/imbalance.    Pertinent History  History obtained form patient and medical record. Patient reports that he began to have issues with dizziness and imbalance in January of 2020. Patient reports that he saw [name] in January 2020 and workup identified hypertension as possible cause of his symptoms. Patient was referred to cardiology. Patient states he was also diagnosed with sleep apnea and he now uses a CPAP machine. Patient reports that his dizziness symptoms began to worsen in January 2021. Patient saw [name], neurologist on May 10, 2019 for sleep follow up. Patient had VNG testing on May 18, 2019 which revealed 57% right unilateral vestibular weakness, decreased pursuit and optokinetic gain and positive right BPPV per MR with subtle down-beating nystagmus present in head-hanging postion. Patient was treated for right BPPV after the VNG test. Patient reports initially this helped mildly reduce his symptoms but reports then he began to have worsening dizziness. Patient states he had a brain MRI two weeks ago that revealed no acute intracranial abnormality. Patient reports that he became dizzy when trying to get up after the MRI and patient states that the MRI technician could observe patient's eyes moving, describing nystagmus. Patient reports he is having vertigo describes as "swirling" sensation and occasional unsteadiness that are motion provoked and positional in nature. Patient reports dizziness lasts a few minutes. Patient reports that he is getting episodes every time he lies down or sits up so multiple  times per day. Patient reports that lying down, sitting up and rolling over brings on his dizziness. Patient reports that nothing makes his symptoms better that he knows of and he has not tried Meclizine or antivert. Patient reports when he was a teenager that "the words mash together on the paper" when he would be reading and that he began to wear glasses. Patient reports he  sometimes will get a "pulsing" headaches on center top, back or right side of the brain. Patient reports he gets these headaches infrequently and reports on average it occurs 1-2 times a month. Patient reports no history of migraines. Patient does report occasional visual changes where he sees "twinkling stars" and states these episodes are not associated with his headaches.    Diagnostic tests  MRI: no acute intracranial abnormality and VNG testing: 57% right unilateral vestibular weakness, decreased pursuit and optokinetic gain and positive right BPPV per MR with subtle down-beating nystagmus present in head-hanging postion    Patient Stated Goals  to be able to play with his daughter and to be able to asemble projects at home-like be able to build a bookcase; to have decreased dizziness       Neuromuscular Re-education:  VOR x 2 exercise:  Patient performed VOR X 2 horiz in sitting 1 rep of 1 minute on firm surface and then 3 reps of 1 minute each standing on Airex pad with CGA. Patient performs with good technique.  Patient reports no dizziness but reports imbalance and noted several small imbalances/sways that patient was able to self-correct during reps.  Body Wall Rolls:  Patient performed 6 reps of supported, body wall rolls with eyes closed with CGA.  Patient reports mild dizziness with this activity.   Card Sorting Activity:  Patient performed deck of card sorting activity with cards placed on 6" step on floor. Patient sorted cards by numbers and then turning to place cards on raised side table to incorporate horizontal and vertical head turning and body turns. Performed standing on firm surface and then repeated standing on Airex balance pad. Patient reports no dizziness with this activity, but reports imbalance and noted several small imbalances/sways that patient was able to self-correct.   Active eye movement between two targets: Discussed and demonstrated active eye movements  between two targets exercise.  Patient performed in standing active eye movements between two targets horizontal 2 reps and vertical 1 rep of about 60 seconds each.  Patient demonstrating good technique. Patient was noted to have mild imbalance and patient reported that horizontal targets were more challenging than vertical targets. Patient states that when he turns his head left he feels he gets imbalanced. Patient denies dizziness.  Hallway ball toss:  In hallway, worked on ball toss against one wall with alternating quick turns to toss ball against opposite wall while tracking with eyes and head. Patient reports this did not recreate his symptoms this date.    PT Education - 08/10/19 1211    Education Details  added VOR X 2 in standing on pillow progression for HEP.    Person(s) Educated  Patient    Methods  Explanation    Comprehension  Verbalized understanding       PT Short Term Goals - 08/10/19 1234      PT SHORT TERM GOAL #1   Title  Patient will report 50% or greater improvement in his symptoms of dizziness and imbalance with provoking motions or positions.    Time  4  Period  Weeks    Status  On-going    Target Date  07/20/19        PT Long Term Goals - 08/10/19 1234      PT LONG TERM GOAL #1   Title  Patient will report 80% or greater improvement in his symptoms of dizziness and imbalance with provoking motions or positions.    Time  12    Period  Weeks    Status  On-going      PT LONG TERM GOAL #2   Title  Patient will report that he is able to resume his prior level of activity including being able to play with his daughter and to be able to build and assemble projects at home without episodes of vertigo.    Time  12    Period  Weeks    Status  Achieved      PT LONG TERM GOAL #3   Title  Patient will reduce perceived disability to low levels as indicated by <40 on Dizziness Handicap Inventory to demonstrate significant improvement in dizziness.    Time  12     Period  Weeks    Status  On-going      PT LONG TERM GOAL #4   Title  Patient will reduce falls risk as indicated by Activities Specific Balance Confidence Scale (ABC) >67%.    Time  12    Period  Weeks    Status  On-going            Plan - 08/10/19 1235    Clinical Impression Statement  Patient able to progress to VOR X 2 in standing on compliant surface this date. Patient did have mild imbalance when performing VOR X 2 in standing on foam and with active eye movement between two targets standing on firm surface. Will plan on trying diagonals and progressions of active eye movements between two targets next session and repeat functional outcome measures and updating goals.    Personal Factors and Comorbidities  Comorbidity 3+;Time since onset of injury/illness/exacerbation    Comorbidities  obstructive sleep apnea, HTN, herpes zoster    Examination-Activity Limitations  Bend;Bed Mobility;Other   lying down, sitting up and rolling over   Examination-Participation Restrictions  --   playing with his daughter and home projects like building/assembling items   Stability/Clinical Decision Making  Evolving/Moderate complexity    Rehab Potential  Excellent    PT Frequency  1x / week    PT Duration  12 weeks    PT Treatment/Interventions  Canalith Repostioning;Balance training;Neuromuscular re-education;Therapeutic exercise;Gait training;Stair training;Therapeutic activities;Patient/family education;Vestibular    PT Next Visit Plan  repeat right Dix-Hallpike test and perform EPley maneuver if indicated    PT Home Exercise Plan  Medbridge Access Code [email or web address]    Consulted and Agree with Plan of Care  Patient       Patient will benefit from skilled therapeutic intervention in order to improve the following deficits and impairments:  Decreased balance, Dizziness  Visit Diagnosis: Dizziness and giddiness  BPPV (benign paroxysmal positional vertigo), right     Problem List There  are no problems to display for this patient.  Lady Deutscher PT, DPT [phone] Lady Deutscher 08/10/2019, 12:40 PM   [facility] [address] [place], [place], [address] Phone: [phone]   Fax:  [phone]  Name: Erik Castaneda MRN: [id number] Date of Birth: 20-Dec-1985

## 2019-08-17 ENCOUNTER — Ambulatory Visit: Payer: BC Managed Care – PPO | Admitting: Physical Therapy

## 2019-08-17 ENCOUNTER — Encounter: Payer: Self-pay | Admitting: Physical Therapy

## 2019-08-17 ENCOUNTER — Other Ambulatory Visit: Payer: Self-pay

## 2019-08-17 DIAGNOSIS — H8111 Benign paroxysmal vertigo, right ear: Secondary | ICD-10-CM

## 2019-08-17 DIAGNOSIS — R42 Dizziness and giddiness: Secondary | ICD-10-CM | POA: Diagnosis not present

## 2019-08-17 NOTE — Therapy (Signed)
[place] [facility], [facility] [address] [place], [place], [address] Phone: [phone]   Fax:  [phone]  Physical Therapy Treatment  Patient Details  Name: Erik Castaneda MRN: [id number] Date of Birth: November 19, 1985 Referring Provider (PT): Dr. Cathlean Sauer   Encounter Date: 08/17/2019  PT End of Session - 08/17/19 0903    Visit Number  9    Number of Visits  13    Date for PT Re-Evaluation  09/21/19    PT Start Time  0901    PT Stop Time  0950    PT Time Calculation (min)  49 min    Equipment Utilized During Treatment  Gait belt    Activity Tolerance  Patient tolerated treatment well    Behavior During Therapy  [facility] for tasks assessed/performed       Past Medical History:  Diagnosis Date  . Complication of anesthesia    slow to wake up and oxygen level dropped  . GERD (gastroesophageal reflux disease)     Past Surgical History:  Procedure Laterality Date  . ENDOSCOPIC CONCHA BULLOSA RESECTION Bilateral 03/11/2016   Procedure: ENDOSCOPIC CONCHA BULLOSA RESECTION;  Surgeon: Margaretha Sheffield, MD;  Location: [place];  Service: ENT;  Laterality: Bilateral;  . LAPAROSCOPIC NISSEN FUNDOPLICATION    . SEPTOPLASTY N/A 03/11/2016   Procedure: SEPTOPLASTY;  Surgeon: Margaretha Sheffield, MD;  Location: [place];  Service: ENT;  Laterality: N/A;  . TONSILLECTOMY N/A 03/11/2016   Procedure: TONSILLECTOMY;  Surgeon: Margaretha Sheffield, MD;  Location: [place];  Service: ENT;  Laterality: N/A;  . TURBINATE REDUCTION Bilateral 03/11/2016   Procedure: PARTIAL TURBINATE REDUCTION;  Surgeon: Margaretha Sheffield, MD;  Location: [place];  Service: ENT;  Laterality: Bilateral;    There were no vitals filed for this visit.  Subjective Assessment - 08/17/19 0904    Subjective  Patient states this past week has been tough. Patient states then it started to clear until this morning he states he had a dizzy spell during a meeting after he  shook his head. Patient states he felt dizziness and had a tiliting feeling and  felt like the vertigo was about to come on after he shook his head.    Pertinent History  History obtained form patient and medical record. Patient reports that he began to have issues with dizziness and imbalance in January of 2020. Patient reports that he saw Dr. Cathlean Sauer in January 2020 and workup identified hypertension as possible cause of his symptoms. Patient was referred to cardiology. Patient states he was also diagnosed with sleep apnea and he now uses a CPAP machine. Patient reports that his dizziness symptoms began to worsen in January 2021. Patient saw Dr. Jeraldine Loots, neurologist on May 10, 2019 for sleep follow up. Patient had VNG testing on May 18, 2019 which revealed 57% right unilateral vestibular weakness, decreased pursuit and optokinetic gain and positive right BPPV per MR with subtle down-beating nystagmus present in head-hanging postion. Patient was treated for right BPPV after the VNG test. Patient reports initially this helped mildly reduce his symptoms but reports then he began to have worsening dizziness. Patient states he had a brain MRI two weeks ago that revealed no acute intracranial abnormality. Patient reports that he became dizzy when trying to get up after the MRI and patient states that the MRI technician could observe patient's eyes moving, describing nystagmus. Patient reports he is having vertigo describes as "swirling" sensation and occasional unsteadiness that are motion provoked  and positional in nature. Patient reports dizziness lasts a few minutes. Patient reports that he is getting episodes every time he lies down or sits up so multiple times per day. Patient reports that lying down, sitting up and rolling over brings on his dizziness. Patient reports that nothing makes his symptoms better that he knows of and he has not tried Meclizine or antivert. Patient reports when he was a teenager  that "the words mash together on the paper" when he would be reading and that he began to wear glasses. Patient reports he sometimes will get a "pulsing" headaches on center top, back or right side of the brain. Patient reports he gets these headaches infrequently and reports on average it occurs 1-2 times a month. Patient reports no history of migraines. Patient does report occasional visual changes where he sees "twinkling stars" and states these episodes are not associated with his headaches.    Diagnostic tests  MRI: no acute intracranial abnormality and VNG testing: 57% right unilateral vestibular weakness, decreased pursuit and optokinetic gain and positive right BPPV per MR with subtle down-beating nystagmus present in head-hanging postion    Patient Stated Goals  to be able to play with his daughter and to be able to asemble projects at home-like be able to build a bookcase; to have decreased dizziness       Neuromuscular Re-education:  FUNCTIONAL OUTCOME MEASURES:  Results Comments  DHI 10/100 low perception of handicap  ABC Scale 96.8% Normal; safe for community mobility  FOTO 93/100    Discussed progress towards goals and functional outcome test results and compared to prior testing. Discussed plan of care. Patient in agreement with continuing PT services at this time.   Canalith Repositioning Maneuver: On mat table, performed left Dix-Hallpike testing and noted multiple down beats of nystagmus that dissipated within 1 minute. Repeated Dix-Hallpike test and it was  positive with left slowly upbeating torsional nystagmus of short duration and patient rated 5/10 vertigo.  Performed left canalith repositioning maneuver (CRT). Repeated left CRT for a total of 2 maneuvers. On third Dix-Hallpike test again noted several pure down-beating nystagmus that dissipated within 1 minute and patient reported 4-5/10 dizziness and states this is how he felt during the meeting after he shook his head  and had dizziness and tilting sensation.  Down-beating nystagmus is potential indicator of central sign, however did note left upbeating, torsional nystagmus of short duration during one of the Dix-Hallpike test in a patient with prior history of right BPPV and therefore performed 2 Epley maneuvers. Will plan on repeat testing next session.   Performed right Dix-Hallpike test and it was negative with no nystagmus observed and patient denied vertigo.      PT Education - 08/17/19 0908    Education Details  repeated functional outcome measures and reviewed results and compared to prior testing and discussed progress towards goals and discussed plan of care    Person(s) Educated  Patient    Methods  Explanation    Comprehension  Verbalized understanding       PT Short Term Goals - 08/17/19 1002      PT SHORT TERM GOAL #1   Title  Patient will report 50% or greater improvement in his symptoms of dizziness and imbalance with provoking motions or positions.    Baseline  Patient reports 75% improvement in his symptoms overall on 08/17/19    Time  4    Period  Weeks    Status  Achieved  Target Date  07/20/19        PT Long Term Goals - 08/17/19 0938      PT LONG TERM GOAL #1   Title  Patient will report 80% or greater improvement in his symptoms of dizziness and imbalance with provoking motions or positions.    Baseline  patient reports 75% or more improvement since onset of symptoms on 08/17/19;    Time  12    Period  Weeks    Status  Partially Met      PT LONG TERM GOAL #2   Title  Patient will report that he is able to resume his prior level of activity including being able to play with his daughter and to be able to build and assemble projects at home without episodes of vertigo.    Baseline  patient reports he has been able to return to these activities    Time  12    Period  Weeks    Status  Achieved      PT LONG TERM GOAL #3   Title  Patient will reduce perceived  disability to low levels as indicated by <40 on Dizziness Handicap Inventory to demonstrate significant improvement in dizziness.    Baseline  scored 48/100 on 06/22/19 and scored 10/100 on 08/17/19;    Time  12    Period  Weeks    Status  Achieved      PT LONG TERM GOAL #4   Title  Patient will reduce falls risk as indicated by Activities Specific Balance Confidence Scale (ABC) >67%.    Baseline  scored 44.4% on 06/22/19; scored 96.8% on 08/17/19    Time  12    Period  Weeks    Status  Achieved      PT LONG TERM GOAL #5   Title  Patient reports no vertigo with provoking motions or positions.    Baseline  reported 5/10 vertigo on 08/17/19    Time  5    Period  Weeks    Status  New    Target Date  09/21/19      Additional Long Term Goals   Additional Long Term Goals  Yes      PT LONG TERM GOAL #6   Title  Patient will be able to perform self-Epley maneuver for self-management of symtpoms if patient's vertigo and BPPV reoccurs.    Time  5    Period  Weeks    Status  New    Target Date  09/21/19            Plan - 08/17/19 1015    Clinical Impression Statement  Patient returns to clinic reporting that he was doing well but that he had a rough last week due to his symptoms of dizziness. Patient repeated functional outcome tests this date and patient has made excellent progress. Patient improved from 44% to 96% on the ABC scale and from 70/100 to 93/100 on the FOTO survey. Patient improved from 48/100 down to 10/100 on the [facility] indicating low perception of handciap. Updated the goals and added addtional goals. Patient with positive left Dix-Hallpike test and performed Epley maneuver. Will plan on teaching self-Epley maneuver to patient once left ear is clear. Plan on continuing PT services to address goals and deficits.    Personal Factors and Comorbidities  Comorbidity 3+;Time since onset of injury/illness/exacerbation    Comorbidities  obstructive sleep apnea, HTN, herpes zoster     Examination-Activity Limitations  Bend;Bed Mobility;Other  lying down, sitting up and rolling over   Examination-Participation Restrictions  --   playing with his daughter and home projects like building/assembling items   Stability/Clinical Decision Making  Evolving/Moderate complexity    Rehab Potential  Excellent    PT Frequency  1x / week    PT Duration  12 weeks    PT Treatment/Interventions  Canalith Repostioning;Balance training;Neuromuscular re-education;Therapeutic exercise;Gait training;Stair training;Therapeutic activities;Patient/family education;Vestibular    PT Next Visit Plan  repeat right Dix-Hallpike test and perform EPley maneuver if indicated    PT Home Exercise Plan  Medbridge Access Code [email or web address]    Consulted and Agree with Plan of Care  Patient       Patient will benefit from skilled therapeutic intervention in order to improve the following deficits and impairments:  Decreased balance, Dizziness  Visit Diagnosis: Dizziness and giddiness  BPPV (benign paroxysmal positional vertigo), right     Problem List There are no problems to display for this patient.  Lady Deutscher PT, DPT [phone] Lady Deutscher 08/17/2019, 12:45 PM  [place] [facility] [address] [place], [place], [address] Phone: [phone]   Fax:  [phone]  Name: Dempsy Damiano MRN: [id number] Date of Birth: 11-09-1985

## 2019-08-24 ENCOUNTER — Other Ambulatory Visit: Payer: Self-pay

## 2019-08-24 ENCOUNTER — Encounter: Payer: Self-pay | Admitting: Physical Therapy

## 2019-08-24 ENCOUNTER — Ambulatory Visit: Payer: BC Managed Care – PPO | Admitting: Physical Therapy

## 2019-08-24 DIAGNOSIS — R42 Dizziness and giddiness: Secondary | ICD-10-CM | POA: Diagnosis not present

## 2019-08-24 DIAGNOSIS — H8111 Benign paroxysmal vertigo, right ear: Secondary | ICD-10-CM

## 2019-08-24 NOTE — Therapy (Signed)
[place] [facility] [address] [place], [place], [address] Phone: [phone]   Fax:  [phone]  Physical Therapy Treatment  Patient Details  Name: Erik Castaneda MRN: [id number] Date of Birth: 1985/10/14 Referring Provider (PT): Dr. Cathlean Sauer   Encounter Date: 08/24/2019  PT End of Session - 08/24/19 0907    Visit Number  10    Number of Visits  13    Date for PT Re-Evaluation  09/21/19    PT Start Time  0904    PT Stop Time  0946    PT Time Calculation (min)  42 min    Equipment Utilized During Treatment  Gait belt    Activity Tolerance  Patient tolerated treatment well    Behavior During Therapy  [facility] for tasks assessed/performed       Past Medical History:  Diagnosis Date  . Complication of anesthesia    slow to wake up and oxygen level dropped  . GERD (gastroesophageal reflux disease)     Past Surgical History:  Procedure Laterality Date  . ENDOSCOPIC CONCHA BULLOSA RESECTION Bilateral 03/11/2016   Procedure: ENDOSCOPIC CONCHA BULLOSA RESECTION;  Surgeon: Margaretha Sheffield, MD;  Location: [place];  Service: ENT;  Laterality: Bilateral;  . LAPAROSCOPIC NISSEN FUNDOPLICATION    . SEPTOPLASTY N/A 03/11/2016   Procedure: SEPTOPLASTY;  Surgeon: Margaretha Sheffield, MD;  Location: [place];  Service: ENT;  Laterality: N/A;  . TONSILLECTOMY N/A 03/11/2016   Procedure: TONSILLECTOMY;  Surgeon: Margaretha Sheffield, MD;  Location: [place];  Service: ENT;  Laterality: N/A;  . TURBINATE REDUCTION Bilateral 03/11/2016   Procedure: PARTIAL TURBINATE REDUCTION;  Surgeon: Margaretha Sheffield, MD;  Location: [place];  Service: ENT;  Laterality: Bilateral;    There were no vitals filed for this visit.  Subjective Assessment - 08/24/19 0905    Subjective  Patient states he had a much better week. Patient states he did some heavy duty outdoor work this past week and that he did well.    Pertinent History  History  obtained form patient and medical record. Patient reports that he began to have issues with dizziness and imbalance in January of 2020. Patient reports that he saw Dr. Cathlean Sauer in January 2020 and workup identified hypertension as possible cause of his symptoms. Patient was referred to cardiology. Patient states he was also diagnosed with sleep apnea and he now uses a CPAP machine. Patient reports that his dizziness symptoms began to worsen in January 2021. Patient saw Dr. Jeraldine Loots, neurologist on May 10, 2019 for sleep follow up. Patient had VNG testing on May 18, 2019 which revealed 57% right unilateral vestibular weakness, decreased pursuit and optokinetic gain and positive right BPPV per MR with subtle down-beating nystagmus present in head-hanging postion. Patient was treated for right BPPV after the VNG test. Patient reports initially this helped mildly reduce his symptoms but reports then he began to have worsening dizziness. Patient states he had a brain MRI two weeks ago that revealed no acute intracranial abnormality. Patient reports that he became dizzy when trying to get up after the MRI and patient states that the MRI technician could observe patient's eyes moving, describing nystagmus. Patient reports he is having vertigo describes as "swirling" sensation and occasional unsteadiness that are motion provoked and positional in nature. Patient reports dizziness lasts a few minutes. Patient reports that he is getting episodes every time he lies down or sits up so multiple times per day. Patient reports that  lying down, sitting up and rolling over brings on his dizziness. Patient reports that nothing makes his symptoms better that he knows of and he has not tried Meclizine or antivert. Patient reports when he was a teenager that "the words mash together on the paper" when he would be reading and that he began to wear glasses. Patient reports he sometimes will get a "pulsing" headaches on center top,  back or right side of the brain. Patient reports he gets these headaches infrequently and reports on average it occurs 1-2 times a month. Patient reports no history of migraines. Patient does report occasional visual changes where he sees "twinkling stars" and states these episodes are not associated with his headaches.    Diagnostic tests  MRI: no acute intracranial abnormality and VNG testing: 57% right unilateral vestibular weakness, decreased pursuit and optokinetic gain and positive right BPPV per MR with subtle down-beating nystagmus present in head-hanging postion    Patient Stated Goals  to be able to play with his daughter and to be able to asemble projects at home-like be able to build a bookcase; to have decreased dizziness       Neuromuscular Re-education:  Dix-Hallpike: Performed left Dix-Hallpike test and it was negative with patient denying vertigo and no nystagmus observed. Educated patient as to how to perform self-Epley using a pillow under thorax. Handout provided and patient demonstrated using handout.   Body Wall Rolls:  Patient performed 1 rep of supported body wall rolls with eyes closed and then 5 reps of unsupported, body wall rolls with eyes closed with SBA. Patient denies dizziness with this activity and was able to perform touching the wall only once for balance and patient able to maintain his orientation and perform in a fairly straight plane/line.   Airex balance beam:  On Airex balance beam, performed sideways stepping with horizontal head turns 5' times 4 reps. On Airex balance beam, performed sideways stepping with vertical head turns 5' times 4 reps. Patient stood on Airex balance beam and practice sidestepping along beam while bending forward to retrieve cones off floor and then turning and placing on floor to the opposite (left) side and then repeated placing cones back to the right side. Performed several reps of this activity. Patient required CGA with this  activity Patient reports no dizziness but states this was challenging for his balance.  Tandem walking: Patient performed tandem walking 15' times 4 reps with markedly decreased speed. Patient did not step off the floor line but demonstrated increased trunk sway and ankle and hip strategies to try to maintain balance.  In // bars, performed forward tandem walking with horizontal head turns and retro tandem amb without head turns with imbalance noted.  Patient with negative Dix-Hallpike test this date. Patient with history of left and right BPPV therefore educated patient on how to safely perform self-Epley should patient's symptoms of vertigo return. Patient denied dizziness with all activities in the clinic this date, but did have significant difficulty with attempts at tandem walking. Patient reports that he has difficulty maintaining his balance when in single leg stance on right leg. Will planext visit to try activities with right single leg stance such as cone tapping, ball rolls and slow marching with head turns as well as tandem walking activities.    PT Education - 08/24/19 0905    Education Details  educated as to self-Epley maneuver    Person(s) Educated  Patient    Methods  Explanation;Handout;Verbal cues  Comprehension  Verbalized understanding;Returned demonstration       PT Short Term Goals - 08/17/19 1002      PT SHORT TERM GOAL #1   Title  Patient will report 50% or greater improvement in his symptoms of dizziness and imbalance with provoking motions or positions.    Baseline  Patient reports 75% improvement in his symptoms overall on 08/17/19    Time  4    Period  Weeks    Status  Achieved    Target Date  07/20/19        PT Long Term Goals - 08/17/19 0938      PT LONG TERM GOAL #1   Title  Patient will report 80% or greater improvement in his symptoms of dizziness and imbalance with provoking motions or positions.    Baseline  patient reports 75% or more  improvement since onset of symptoms on 08/17/19;    Time  12    Period  Weeks    Status  Partially Met      PT LONG TERM GOAL #2   Title  Patient will report that he is able to resume his prior level of activity including being able to play with his daughter and to be able to build and assemble projects at home without episodes of vertigo.    Baseline  patient reports he has been able to return to these activities    Time  12    Period  Weeks    Status  Achieved      PT LONG TERM GOAL #3   Title  Patient will reduce perceived disability to low levels as indicated by <40 on Dizziness Handicap Inventory to demonstrate significant improvement in dizziness.    Baseline  scored 48/100 on 06/22/19 and scored 10/100 on 08/17/19;    Time  12    Period  Weeks    Status  Achieved      PT LONG TERM GOAL #4   Title  Patient will reduce falls risk as indicated by Activities Specific Balance Confidence Scale (ABC) >67%.    Baseline  scored 44.4% on 06/22/19; scored 96.8% on 08/17/19    Time  12    Period  Weeks    Status  Achieved      PT LONG TERM GOAL #5   Title  Patient reports no vertigo with provoking motions or positions.    Baseline  reported 5/10 vertigo on 08/17/19    Time  5    Period  Weeks    Status  New    Target Date  09/21/19      Additional Long Term Goals   Additional Long Term Goals  Yes      PT LONG TERM GOAL #6   Title  Patient will be able to perform self-Epley maneuver for self-management of symtpoms if patient's vertigo and BPPV reoccurs.    Time  5    Period  Weeks    Status  New    Target Date  09/21/19            Plan - 08/24/19 1953    Clinical Impression Statement  Patient with negative Dix-Hallpike test this date. Patient with history of left and right BPPV therefore educated patient on how to safely perform self-Epley should patient's symptoms of vertigo return. Patient denied dizziness with all activities in the clinic this date, but did have significant  difficulty with attempts at tandem walking. Patient reports that he has difficulty  maintaining his balance when in single leg stance on right leg. Will planext visit to try activities with right single leg stance such as cone tapping, ball rolls and slow marching with head turns as well as tandem walking activities.    Personal Factors and Comorbidities  Comorbidity 3+;Time since onset of injury/illness/exacerbation    Comorbidities  obstructive sleep apnea, HTN, herpes zoster    Examination-Activity Limitations  Bend;Bed Mobility;Other   lying down, sitting up and rolling over   Examination-Participation Restrictions  --   playing with his daughter and home projects like building/assembling items   Stability/Clinical Decision Making  Evolving/Moderate complexity    Rehab Potential  Excellent    PT Frequency  1x / week    PT Duration  12 weeks    PT Treatment/Interventions  Canalith Repostioning;Balance training;Neuromuscular re-education;Therapeutic exercise;Gait training;Stair training;Therapeutic activities;Patient/family education;Vestibular    PT Next Visit Plan  repeat right Dix-Hallpike test and perform EPley maneuver if indicated    PT Home Exercise Plan  Medbridge Access Code [email or web address]    Consulted and Agree with Plan of Care  Patient       Patient will benefit from skilled therapeutic intervention in order to improve the following deficits and impairments:  Decreased balance, Dizziness  Visit Diagnosis: Dizziness and giddiness  BPPV (benign paroxysmal positional vertigo), right     Problem List There are no problems to display for this patient.  Lady Deutscher PT, DPT [phone] Lady Deutscher 08/24/2019, 8:41 PM  [place] [facility] [address] [place], [place], [address] Phone: [phone]   Fax:  [phone]  Name: Erik Castaneda MRN: [id number] Date of Birth: April 16, 1986

## 2019-09-07 ENCOUNTER — Other Ambulatory Visit: Payer: Self-pay

## 2019-09-07 ENCOUNTER — Ambulatory Visit: Payer: BC Managed Care – PPO | Attending: Otolaryngology | Admitting: Physical Therapy

## 2019-09-07 ENCOUNTER — Encounter: Payer: Self-pay | Admitting: Physical Therapy

## 2019-09-07 DIAGNOSIS — R42 Dizziness and giddiness: Secondary | ICD-10-CM | POA: Insufficient documentation

## 2019-09-07 NOTE — Therapy (Signed)
[facility] [address] [place], [place], [address] Phone: [phone]   Fax:  [phone]  Physical Therapy Treatment  Patient Details  Name: Erik Castaneda MRN: [id number] Date of Birth: 1985/07/28 Referring Provider (PT): Dr. Cathlean Sauer   Encounter Date: 09/07/2019  PT End of Session - 09/07/19 0850    Visit Number  11    Number of Visits  13    Date for PT Re-Evaluation  09/21/19    PT Start Time  0850    PT Stop Time  0934    PT Time Calculation (min)  44 min    Equipment Utilized During Treatment  Gait belt    Activity Tolerance  Patient tolerated treatment well    Behavior During Therapy  [facility] for tasks assessed/performed       Past Medical History:  Diagnosis Date  . Complication of anesthesia    slow to wake up and oxygen level dropped  . GERD (gastroesophageal reflux disease)     Past Surgical History:  Procedure Laterality Date  . ENDOSCOPIC CONCHA BULLOSA RESECTION Bilateral 03/11/2016   Procedure: ENDOSCOPIC CONCHA BULLOSA RESECTION;  Surgeon: Margaretha Sheffield, MD;  Location: [place];  Service: ENT;  Laterality: Bilateral;  . LAPAROSCOPIC NISSEN FUNDOPLICATION    . SEPTOPLASTY N/A 03/11/2016   Procedure: SEPTOPLASTY;  Surgeon: Margaretha Sheffield, MD;  Location: [place];  Service: ENT;  Laterality: N/A;  . TONSILLECTOMY N/A 03/11/2016   Procedure: TONSILLECTOMY;  Surgeon: Margaretha Sheffield, MD;  Location: [place];  Service: ENT;  Laterality: N/A;  . TURBINATE REDUCTION Bilateral 03/11/2016   Procedure: PARTIAL TURBINATE REDUCTION;  Surgeon: Margaretha Sheffield, MD;  Location: [place];  Service: ENT;  Laterality: Bilateral;    There were no vitals filed for this visit.  Subjective Assessment - 09/07/19 0852    Subjective  Patient states he did pretty good this past week.    Pertinent History  History obtained form patient and medical record. Patient reports that he began to have  issues with dizziness and imbalance in January of 2020. Patient reports that he saw Dr. Cathlean Sauer in January 2020 and workup identified hypertension as possible cause of his symptoms. Patient was referred to cardiology. Patient states he was also diagnosed with sleep apnea and he now uses a CPAP machine. Patient reports that his dizziness symptoms began to worsen in January 2021. Patient saw Dr. Jeraldine Loots, neurologist on May 10, 2019 for sleep follow up. Patient had VNG testing on May 18, 2019 which revealed 57% right unilateral vestibular weakness, decreased pursuit and optokinetic gain and positive right BPPV per MR with subtle down-beating nystagmus present in head-hanging postion. Patient was treated for right BPPV after the VNG test. Patient reports initially this helped mildly reduce his symptoms but reports then he began to have worsening dizziness. Patient states he had a brain MRI two weeks ago that revealed no acute intracranial abnormality. Patient reports that he became dizzy when trying to get up after the MRI and patient states that the MRI technician could observe patient's eyes moving, describing nystagmus. Patient reports he is having vertigo describes as "swirling" sensation and occasional unsteadiness that are motion provoked and positional in nature. Patient reports dizziness lasts a few minutes. Patient reports that he is getting episodes every time he lies down or sits up so multiple times per day. Patient reports that lying down, sitting up and rolling over brings on his dizziness. Patient reports that nothing makes  his symptoms better that he knows of and he has not tried Meclizine or antivert. Patient reports when he was a teenager that "the words mash together on the paper" when he would be reading and that he began to wear glasses. Patient reports he sometimes will get a "pulsing" headaches on center top, back or right side of the brain. Patient reports he gets these headaches  infrequently and reports on average it occurs 1-2 times a month. Patient reports no history of migraines. Patient does report occasional visual changes where he sees "twinkling stars" and states these episodes are not associated with his headaches.    Diagnostic tests  MRI: no acute intracranial abnormality and VNG testing: 57% right unilateral vestibular weakness, decreased pursuit and optokinetic gain and positive right BPPV per MR with subtle down-beating nystagmus present in head-hanging postion    Patient Stated Goals  to be able to play with his daughter and to be able to asemble projects at home-like be able to build a bookcase; to have decreased dizziness      Neuromuscular Re-education:  [name] circles: Worked on standing on Airex pad and then repeated standing on Airex balance beam static sideways stance with one foot on ball while doing clockwise and counterclockwise rolls multiple reps each foot with no UEs support with CGA.   Cone tapping: In standing on Airex pad, patient performed foot tapping to cones in series of one, two, three cones as called out by therapist and then patient self-selected with CGA.  Patient with no losses of balance where he needed to reach // bars for support. Then, patient standing on Airex pad, performed double tapping to cones in series of 3 cones self-selected and as called out by therapist with CGA. Patient requiring assistance ranging from [place].  Discussed how could do tapping to canned goods placed on the floor with patient standing on pillow at home for HEP.   Tandem walking: Patient performed tandem walking 12' times multiple reps with CGA.  Patient initially not stepping off floor line marking, but increased trunk sway noted when trying to advance right leg.  Patient repeated tandem walking 12' times multiple reps with adding in horizontal head turns with CGA.    Slow Marching: Patient performed on Airex balance pad, slow marching with 5 second holds  1 set of 10 reps and then repeated slow marching with 5 second holds with horizontal head turns 1 set of 10 reps with CGA.   Quick Turns:  Patient performed 6 reps of walking 10' with alternating quick turns left and right.  Patient denies dizziness with this activity and no veering noted.   Ball toss over shoulder: Patient performed multiple 39' trials of retro ambulation while tossing ball over one shoulder with return catch over opposite shoulder varying the ball position to head, shoulder and waist level to promote head turning and tilting. Patient reports mild dizziness at the end of the activity.     PT Education - 09/07/19 0853    Education Details  discussed HEP and added tapping to canned goods placed on the floor while standing on a pillow and slow marching standing on pillow with 5 second holds with horizontal head turns    Person(s) Educated  Patient    Methods  Explanation;Handout    Comprehension  Verbalized understanding       PT Short Term Goals - 08/17/19 1002      PT SHORT TERM GOAL #1   Title  Patient will report  50% or greater improvement in his symptoms of dizziness and imbalance with provoking motions or positions.    Baseline  Patient reports 75% improvement in his symptoms overall on 08/17/19    Time  4    Period  Weeks    Status  Achieved    Target Date  07/20/19        PT Long Term Goals - 09/07/19 1246      PT LONG TERM GOAL #1   Title  Patient will report 80% or greater improvement in his symptoms of dizziness and imbalance with provoking motions or positions.    Baseline  patient reports 75% or more improvement since onset of symptoms on 08/17/19;    Time  12    Period  Weeks    Status  Partially Met      PT LONG TERM GOAL #2   Title  Patient will report that he is able to resume his prior level of activity including being able to play with his daughter and to be able to build and assemble projects at home without episodes of vertigo.    Baseline   patient reports he has been able to return to these activities    Time  12    Period  Weeks    Status  Achieved      PT LONG TERM GOAL #3   Title  Patient will reduce perceived disability to low levels as indicated by <40 on Dizziness Handicap Inventory to demonstrate significant improvement in dizziness.    Baseline  scored 48/100 on 06/22/19 and scored 10/100 on 08/17/19;    Time  12    Period  Weeks    Status  Achieved      PT LONG TERM GOAL #4   Title  Patient will reduce falls risk as indicated by Activities Specific Balance Confidence Scale (ABC) >67%.    Baseline  scored 44.4% on 06/22/19; scored 96.8% on 08/17/19    Time  12    Period  Weeks    Status  Achieved      PT LONG TERM GOAL #5   Title  Patient reports no vertigo with provoking motions or positions.    Baseline  reported 5/10 vertigo on 08/17/19    Time  5    Period  Weeks    Status  New      PT LONG TERM GOAL #6   Title  Patient will be able to perform self-Epley maneuver for self-management of symtpoms if patient's vertigo and BPPV reoccurs.    Time  5    Period  Weeks    Status  Achieved            Plan - 09/07/19 0853    Clinical Impression Statement  Patient returns to clinic reporting compliance with HEP and reports that he did well this past week in regards to his symptoms. Patient worked on single leg stance activities this date and improved with practice. Patient does have more difficulty with standing on right leg as compared to left and noted increased wobbling/tremulousness with standing on right leg however, patient was able to maintain the single leg stance position without reaching for support or stepping. Patient did well with quick turn activities without dizziness. Patient progressing well with therapy. Will plan on working on single leg stance and tandem walking activities next session and then repeating functional outcome meaasures in two weeks to assess progres towards goals.    Personal  Factors and  Comorbidities  Comorbidity 3+;Time since onset of injury/illness/exacerbation    Comorbidities  obstructive sleep apnea, HTN, herpes zoster    Examination-Activity Limitations  Bend;Bed Mobility;Other   lying down, sitting up and rolling over   Examination-Participation Restrictions  --   playing with his daughter and home projects like building/assembling items   Stability/Clinical Decision Making  Evolving/Moderate complexity    Rehab Potential  Excellent    PT Frequency  1x / week    PT Duration  12 weeks    PT Treatment/Interventions  Canalith Repostioning;Balance training;Neuromuscular re-education;Therapeutic exercise;Gait training;Stair training;Therapeutic activities;Patient/family education;Vestibular    PT Next Visit Plan  repeat right Dix-Hallpike test and perform EPley maneuver if indicated    PT Home Exercise Plan  Medbridge Access Code [email or web address]; tapping to canned goods placed on the floor while standing on a pillow and slow marching standing on pillow with 5 second holds with horizontal head turns    Consulted and Agree with Plan of Care  Patient       Patient will benefit from skilled therapeutic intervention in order to improve the following deficits and impairments:  Decreased balance, Dizziness  Visit Diagnosis: Dizziness and giddiness     Problem List There are no problems to display for this patient.  Lady Deutscher PT, DPT [phone] Lady Deutscher 09/07/2019, 1:08 PM  [place] [facility] [address] [place], [place], [address] Phone: [phone]   Fax:  [phone]  Name: Erik Castaneda MRN: [id number] Date of Birth: 06/20/1985

## 2019-09-14 ENCOUNTER — Ambulatory Visit: Payer: BC Managed Care – PPO | Admitting: Physical Therapy

## 2019-09-14 ENCOUNTER — Encounter: Payer: Self-pay | Admitting: Physical Therapy

## 2019-09-14 ENCOUNTER — Other Ambulatory Visit: Payer: Self-pay

## 2019-09-14 DIAGNOSIS — R42 Dizziness and giddiness: Secondary | ICD-10-CM | POA: Diagnosis not present

## 2019-09-14 NOTE — Therapy (Signed)
[place] [facility] [address] [place], [place], [address] Phone: [phone]   Fax:  [phone]  Physical Therapy Treatment  Patient Details  Name: Erik Castaneda MRN: [id number] Date of Birth: [birth year] Referring Provider (PT): Dr. Cathlean Sauer   Encounter Date: 09/14/2019  PT End of Session - 09/14/19 0857    Visit Number  12    Number of Visits  13    Date for PT Re-Evaluation  09/21/19    PT Start Time  0850    PT Stop Time  0933    PT Time Calculation (min)  43 min    Equipment Utilized During Treatment  Gait belt    Activity Tolerance  Patient tolerated treatment well    Behavior During Therapy  [facility] for tasks assessed/performed       Past Medical History:  Diagnosis Date  . Complication of anesthesia    slow to wake up and oxygen level dropped  . GERD (gastroesophageal reflux disease)     Past Surgical History:  Procedure Laterality Date  . ENDOSCOPIC CONCHA BULLOSA RESECTION Bilateral 03/11/2016   Procedure: ENDOSCOPIC CONCHA BULLOSA RESECTION;  Surgeon: Margaretha Sheffield, MD;  Location: [place];  Service: ENT;  Laterality: Bilateral;  . LAPAROSCOPIC NISSEN FUNDOPLICATION    . SEPTOPLASTY N/A 03/11/2016   Procedure: SEPTOPLASTY;  Surgeon: Margaretha Sheffield, MD;  Location: [place];  Service: ENT;  Laterality: N/A;  . TONSILLECTOMY N/A 03/11/2016   Procedure: TONSILLECTOMY;  Surgeon: Margaretha Sheffield, MD;  Location: [place];  Service: ENT;  Laterality: N/A;  . TURBINATE REDUCTION Bilateral 03/11/2016   Procedure: PARTIAL TURBINATE REDUCTION;  Surgeon: Margaretha Sheffield, MD;  Location: [place];  Service: ENT;  Laterality: Bilateral;    There were no vitals filed for this visit.  Subjective Assessment - 09/14/19 0853    Subjective  Patient states he did a lot of building outdoor work last week and he did good without any issues. He was putting together a swing set. Patient states he had a  slight movement sensation when on a bed once this past week, but reports he did not have any vertigo.    Pertinent History  History obtained form patient and medical record. Patient reports that he began to have issues with dizziness and imbalance in January of 2020. Patient reports that he saw Dr. Cathlean Sauer in January 2020 and workup identified hypertension as possible cause of his symptoms. Patient was referred to cardiology. Patient states he was also diagnosed with sleep apnea and he now uses a CPAP machine. Patient reports that his dizziness symptoms began to worsen in January 2021. Patient saw Dr. Jeraldine Loots, neurologist on May 10, 2019 for sleep follow up. Patient had VNG testing on May 18, 2019 which revealed 57% right unilateral vestibular weakness, decreased pursuit and optokinetic gain and positive right BPPV per MR with subtle down-beating nystagmus present in head-hanging postion. Patient was treated for right BPPV after the VNG test. Patient reports initially this helped mildly reduce his symptoms but reports then he began to have worsening dizziness. Patient states he had a brain MRI two weeks ago that revealed no acute intracranial abnormality. Patient reports that he became dizzy when trying to get up after the MRI and patient states that the MRI technician could observe patient's eyes moving, describing nystagmus. Patient reports he is having vertigo describes as "swirling" sensation and occasional unsteadiness that are motion provoked and positional in nature. Patient reports dizziness lasts a  few minutes. Patient reports that he is getting episodes every time he lies down or sits up so multiple times per day. Patient reports that lying down, sitting up and rolling over brings on his dizziness. Patient reports that nothing makes his symptoms better that he knows of and he has not tried Meclizine or antivert. Patient reports when he was a teenager that "the words mash together on the paper" when  he would be reading and that he began to wear glasses. Patient reports he sometimes will get a "pulsing" headaches on center top, back or right side of the brain. Patient reports he gets these headaches infrequently and reports on average it occurs 1-2 times a month. Patient reports no history of migraines. Patient does report occasional visual changes where he sees "twinkling stars" and states these episodes are not associated with his headaches.    Diagnostic tests  MRI: no acute intracranial abnormality and VNG testing: 57% right unilateral vestibular weakness, decreased pursuit and optokinetic gain and positive right BPPV per MR with subtle down-beating nystagmus present in head-hanging postion    Patient Stated Goals  to be able to play with his daughter and to be able to asemble projects at home-like be able to build a bookcase; to have decreased dizziness       Neuromuscular Re-education:  [name] toss over shoulder: Patient performed multiple 175' trials of retro ambulation while tossing ball over one shoulder with return catch over opposite shoulder varying the ball position to head, shoulder and waist level to promote head turning and tilting.  Bounce Passes: Patient performed ambulation 175' trials while doing alternating sides bounce passes to self with ball while tracking ball with eyes and head.  Patient did well with this activity with no swaying or uneven steppage noted and patient denied dizziness.  Hallway ball toss:  In hallway, worked on walking while tossing ball against one wall with alternating quick turns to toss ball against opposite wall while tracking with eyes and head. Patient did well with this activity with no swaying or uneven steppage noted and patient denied dizziness.  Tandem walking: Patient performed tandem walking 8' times multiple reps initially walking on blue foam floor mat and then progressed to Airex balance beam.  Patient with improved balance with tandem  walking this date without trunk sway or veering noted.   Heel Raises: Patient able to do 7 reps right heel and 12 rep left heel raises before fatiguing.  Patient performed heel raises with two finger support for balance grossly 10 reps each leg. Noted wobbling at right ankle with heel raises. Patient issued heel raises for HEP.   Toe Walking and Heel Walking: Worked on alternating 75' trials of toe walking and heel walking in hallway. Patient had difficulty with performing walking smoothly and wobbling of right heel.   Ball circles: Worked on standing on Airex pad with one foot on ball while doing clockwise rolls multiple reps each foot without UEs support.  Repeated exercise doing forward/backward movement on ball. Performed multiple sets of this exercise.  Performed alternating step taps to ball while standing on Airex pad.   Access Code: [email or web address] URL: [email or web address] Date: 09/14/2019 Prepared by: Lady Deutscher Exercises Standing Single Leg Heel Raise - 1 x daily - 7 x weekly - 2 sets - 20 reps Toe Walking - 1 x daily - 7 x weekly - 1 sets - 10 reps Heel Walking - 1 x daily - 7 x weekly - 1 sets - 10  reps  Patient progressing well with therapy and demonstrated good improvement with tandem walking activities as well as ball toss over shoulder activities this date. Patient did have difficulty with right ankle strength and stability as patient had difficulty with heel raises and toe walking activities. Added these activities for home exercise program. Will plan on repeating functional outcome measures next session and updating goals. In addition, plan on working on tandem walking on wooden balance beam next session. Patient would benefit from continued PT services to further address deficits and goals.         PT Education - 09/14/19 1011    Education Details  discussed plan of care; issued toe and heel walking and heel raises for home exercise program via  [place]    Person(s) Educated  Patient    Methods  Explanation;Handout;Demonstration    Comprehension  Verbalized understanding;Returned demonstration       PT Short Term Goals - 08/17/19 1002      PT SHORT TERM GOAL #1   Title  Patient will report 50% or greater improvement in his symptoms of dizziness and imbalance with provoking motions or positions.    Baseline  Patient reports 75% improvement in his symptoms overall on 08/17/19    Time  4    Period  Weeks    Status  Achieved    Target Date  07/20/19        PT Long Term Goals - 09/07/19 1246      PT LONG TERM GOAL #1   Title  Patient will report 80% or greater improvement in his symptoms of dizziness and imbalance with provoking motions or positions.    Baseline  patient reports 75% or more improvement since onset of symptoms on 08/17/19;    Time  12    Period  Weeks    Status  Partially Met      PT LONG TERM GOAL #2   Title  Patient will report that he is able to resume his prior level of activity including being able to play with his daughter and to be able to build and assemble projects at home without episodes of vertigo.    Baseline  patient reports he has been able to return to these activities    Time  12    Period  Weeks    Status  Achieved      PT LONG TERM GOAL #3   Title  Patient will reduce perceived disability to low levels as indicated by <40 on Dizziness Handicap Inventory to demonstrate significant improvement in dizziness.    Baseline  scored 48/100 on 06/22/19 and scored 10/100 on 08/17/19;    Time  12    Period  Weeks    Status  Achieved      PT LONG TERM GOAL #4   Title  Patient will reduce falls risk as indicated by Activities Specific Balance Confidence Scale (ABC) >67%.    Baseline  scored 44.4% on 06/22/19; scored 96.8% on 08/17/19    Time  12    Period  Weeks    Status  Achieved      PT LONG TERM GOAL #5   Title  Patient reports no vertigo with provoking motions or positions.    Baseline   reported 5/10 vertigo on 08/17/19    Time  5    Period  Weeks    Status  New      PT LONG TERM GOAL #6   Title  Patient  will be able to perform self-Epley maneuver for self-management of symtpoms if patient's vertigo and BPPV reoccurs.    Time  5    Period  Weeks    Status  Achieved            Plan - 09/14/19 0919    Clinical Impression Statement  Patient progressing well with therapy and demonstrated good improvement with tandem walking activities as well as ball toss over shoulder activities this date. Patient did have difficulty with right ankle strength and stability as patient had difficulty with heel raises and toe walking activities. Added these activities for home exercise program. Will plan on repeating functional outcome measures next session and updating goals. In addition, plan on working on tandem walking on wooden balance beam next session. Patient would benefit from continued PT services to further address deficits and goals.    Personal Factors and Comorbidities  Comorbidity 3+;Time since onset of injury/illness/exacerbation    Comorbidities  obstructive sleep apnea, HTN, herpes zoster    Examination-Activity Limitations  Bend;Bed Mobility;Other   lying down, sitting up and rolling over   Examination-Participation Restrictions  --   playing with his daughter and home projects like building/assembling items   Stability/Clinical Decision Making  Evolving/Moderate complexity    Rehab Potential  Excellent    PT Frequency  1x / week    PT Duration  12 weeks    PT Treatment/Interventions  Canalith Repostioning;Balance training;Neuromuscular re-education;Therapeutic exercise;Gait training;Stair training;Therapeutic activities;Patient/family education;Vestibular    PT Next Visit Plan  repeat right Dix-Hallpike test and perform EPley maneuver if indicated    PT Home Exercise Plan  Medbridge Access Code [facility];  Access Code: [email or web address]; tapping to canned goods placed on the floor  while standing on a pillow and slow marching standing on pillow with 5 second holds with horizontal head turns    Consulted and Agree with Plan of Care  Patient       Patient will benefit from skilled therapeutic intervention in order to improve the following deficits and impairments:  Decreased balance, Dizziness  Visit Diagnosis: Dizziness and giddiness     Problem List There are no problems to display for this patient.  Lady Deutscher PT, DPT [phone] Lady Deutscher 09/14/2019, 10:13 AM  [facility] [address] [place], [place], [address] Phone: [phone]   Fax:  [phone]  Name: Erik Castaneda MRN: [id number] Date of Birth: 13-Jul-1985

## 2019-09-21 ENCOUNTER — Ambulatory Visit: Payer: BC Managed Care – PPO | Admitting: Physical Therapy

## 2019-09-21 ENCOUNTER — Encounter: Payer: Self-pay | Admitting: Physical Therapy

## 2019-09-21 ENCOUNTER — Other Ambulatory Visit: Payer: Self-pay

## 2019-09-21 DIAGNOSIS — R42 Dizziness and giddiness: Secondary | ICD-10-CM

## 2019-09-21 NOTE — Therapy (Signed)
[facility] [address] [place], [place], [address] Phone: [phone]   Fax:  [phone]  Physical Therapy Treatment/Discharge Summary   Dates of service:  2/19 /2021-09/21/19 Total Number of Visits: 13 Patient Details  Name: Erik Castaneda MRN: [id number] Date of Birth: 1986/03/29 Referring Provider (PT): Dr. Cathlean Sauer   Encounter Date: 09/21/2019  PT End of Session - 09/21/19 0851    Visit Number  13    Number of Visits  13    Date for PT Re-Evaluation  09/21/19    PT Start Time  0848    PT Stop Time  0926    PT Time Calculation (min)  38 min    Equipment Utilized During Treatment  Gait belt    Activity Tolerance  Patient tolerated treatment well    Behavior During Therapy  [facility] for tasks assessed/performed       Past Medical History:  Diagnosis Date  . Complication of anesthesia    slow to wake up and oxygen level dropped  . GERD (gastroesophageal reflux disease)     Past Surgical History:  Procedure Laterality Date  . ENDOSCOPIC CONCHA BULLOSA RESECTION Bilateral 03/11/2016   Procedure: ENDOSCOPIC CONCHA BULLOSA RESECTION;  Surgeon: Margaretha Sheffield, MD;  Location: [facility];  Service: ENT;  Laterality: Bilateral;  . LAPAROSCOPIC NISSEN FUNDOPLICATION    . SEPTOPLASTY N/A 03/11/2016   Procedure: SEPTOPLASTY;  Surgeon: Margaretha Sheffield, MD;  Location: [facility];  Service: ENT;  Laterality: N/A;  . TONSILLECTOMY N/A 03/11/2016   Procedure: TONSILLECTOMY;  Surgeon: Margaretha Sheffield, MD;  Location: [facility];  Service: ENT;  Laterality: N/A;  . TURBINATE REDUCTION Bilateral 03/11/2016   Procedure: PARTIAL TURBINATE REDUCTION;  Surgeon: Margaretha Sheffield, MD;  Location: [facility];  Service: ENT;  Laterality: Bilateral;    There were no vitals filed for this visit.   [facility] PT Assessment - 09/21/19 0944      Standardized Balance Assessment   Standardized Balance Assessment  Dynamic Gait  Index      Dynamic Gait Index   Level Surface  Normal    Change in Gait Speed  Normal    Gait with Horizontal Head Turns  Normal    Gait with Vertical Head Turns  Normal    Gait and Pivot Turn  Normal    Step Over Obstacle  Normal    Step Around Obstacles  Normal    Steps  Normal    Total Score  24        Subjective Assessment - 09/21/19 0947    Subjective  Patient reports that as the week went along as we was trying to get his pants on in the morning he felt like he was not leaning and was able to keep his balance. Patient states that he felt this was the last thing that he wanted to conquer in therapy. Patient reports no questions or issues with home exercise program.    Pertinent History  History obtained form patient and medical record. Patient reports that he began to have issues with dizziness and imbalance in January of 2020. Patient reports that he saw Dr. Cathlean Sauer in January 2020 and workup identified hypertension as possible cause of his symptoms. Patient was referred to cardiology. Patient states he was also diagnosed with sleep apnea and he now uses a CPAP machine. Patient reports that his dizziness symptoms began to worsen in January 2021. Patient saw Dr. Jeraldine Loots, neurologist on May 10, 2019  for sleep follow up. Patient had VNG testing on May 18, 2019 which revealed 57% right unilateral vestibular weakness, decreased pursuit and optokinetic gain and positive right BPPV per MR with subtle down-beating nystagmus present in head-hanging postion. Patient was treated for right BPPV after the VNG test. Patient reports initially this helped mildly reduce his symptoms but reports then he began to have worsening dizziness. Patient states he had a brain MRI two weeks ago that revealed no acute intracranial abnormality. Patient reports that he became dizzy when trying to get up after the MRI and patient states that the MRI technician could observe patient's eyes moving, describing  nystagmus. Patient reports he is having vertigo describes as "swirling" sensation and occasional unsteadiness that are motion provoked and positional in nature. Patient reports dizziness lasts a few minutes. Patient reports that he is getting episodes every time he lies down or sits up so multiple times per day. Patient reports that lying down, sitting up and rolling over brings on his dizziness. Patient reports that nothing makes his symptoms better that he knows of and he has not tried Meclizine or antivert. Patient reports when he was a teenager that "the words mash together on the paper" when he would be reading and that he began to wear glasses. Patient reports he sometimes will get a "pulsing" headaches on center top, back or right side of the brain. Patient reports he gets these headaches infrequently and reports on average it occurs 1-2 times a month. Patient reports no history of migraines. Patient does report occasional visual changes where he sees "twinkling stars" and states these episodes are not associated with his headaches.    Diagnostic tests  MRI: no acute intracranial abnormality and VNG testing: 57% right unilateral vestibular weakness, decreased pursuit and optokinetic gain and positive right BPPV per MR with subtle down-beating nystagmus present in head-hanging postion    Patient Stated Goals  to be able to play with his daughter and to be able to asemble projects at home-like be able to build a bookcase; to have decreased dizziness      Patient reports that as the week went along as we was trying to get his pants on in the morning he felt like he was not leaning and was able to keep his balance. Patient states that he felt this was the last thing that he wanted to conquer in therapy. Patient reports no questions or issues with home exercise program.    Neuromuscular Re-education and Self-management: Performed DGI test and patient scored 24/24. Patient dizziness throughout testing.  Patient with no episodes of loss of balance or sway.  Patient completed DHI, FOTO and ABC scale.  FUNCTIONAL OUTCOME MEASURES:  Results Comments  DHI 0/100 Low perception of handicap  ABC Scale 100% Normal  DGI 24/24 Safe for community mobility  FOTO 97/100 Above average as compared to like compared groups   Discussed outcome measure testing results and compared to prior testing. Discussed goals and discharge plans. Patient in agreement with discharge from PT services at this time and patient plans to continue HEP upon discharge. Discussed that he could try to gradually ween down the number of times he performs HEP each week as long as his symptoms of dizziness and imbalance do not increase.   Therapeutic Exercise: Patient able to do 20 reps left and right ankle heel raises through partial ankle ROM with two finger support of both hands on // bars. Patient demonstrates improved control and able to increase reps  on right ankle this week.  Patient to continue to practice this exercise for home program to further work on improving ankle strength, stability and balance. Patient performed 8' times 4 reps of toe walking. Patient demonstrates improvement in ability to perform this activity this week as he was able to clear his heels higher and noted min wobbling of right ankle as compared to mod wobbling last week.     PT Education - 09/21/19 [phone]    Education Details  discussed goals, functional outcome testing and compared to prior testing, discussed discharge plans and HEP    Person(s) Educated  Patient    Methods  Explanation    Comprehension  Verbalized understanding       PT Short Term Goals - 08/17/19 1002      PT SHORT TERM GOAL #1   Title  Patient will report 50% or greater improvement in his symptoms of dizziness and imbalance with provoking motions or positions.    Baseline  Patient reports 75% improvement in his symptoms overall on 08/17/19    Time  4    Period  Weeks    Status   Achieved    Target Date  07/20/19        PT Long Term Goals - 09/21/19 0853      PT LONG TERM GOAL #1   Title  Patient will report 80% or greater improvement in his symptoms of dizziness and imbalance with provoking motions or positions.    Baseline  patient reports 75% or more improvement since onset of symptoms on 08/17/19; reports 98% on 09/21/19    Time  12    Period  Weeks    Status  Achieved      PT LONG TERM GOAL #2   Title  Patient will report that he is able to resume his prior level of activity including being able to play with his daughter and to be able to build and assemble projects at home without episodes of vertigo.    Baseline  patient reports he has been able to return to these activities; on 09/21/19 reports that he has been able to do all prior activties    Time  12    Period  Weeks    Status  Achieved      PT LONG TERM GOAL #3   Title  Patient will reduce perceived disability to low levels as indicated by <40 on Dizziness Handicap Inventory to demonstrate significant improvement in dizziness.    Baseline  scored 48/100 on 06/22/19 and scored 10/100 on 08/17/19; scored 0/100 on 09/21/19    Time  12    Period  Weeks    Status  Achieved      PT LONG TERM GOAL #4   Title  Patient will reduce falls risk as indicated by Activities Specific Balance Confidence Scale (ABC) >67%.    Baseline  scored 44.4% on 06/22/19; scored 96.8% on 08/17/19; scored 100% on 09/21/19    Time  12    Period  Weeks    Status  Achieved      PT LONG TERM GOAL #5   Title  Patient reports no vertigo with provoking motions or positions.    Baseline  reported 5/10 vertigo on 08/17/19; reports no vertigo on 09/21/19    Time  5    Period  Weeks    Status  Achieved      PT LONG TERM GOAL #6   Title  Patient will be able  to perform self-Epley maneuver for self-management of symtpoms if patient's vertigo and BPPV reoccurs.    Time  5    Period  Weeks    Status  Achieved          Plan -  09/21/19 0959    Clinical Impression Statement  Patient has done excellent with vestibular rehab and he has met all goals as set on plan of care. Patient reports 98% improvement in his overall symptoms of dizziness and imbalance since starting vestibular therapy. Patient improved from 48/100 to 0/100 on the [facility] and improved from 44% to 100% on the ABC scale which are perfect scores. Patient improved from 70/100 to 97/100 on the FOTO outcome measure. Patient reports that he has been able to resume all prior activities without difficulties. Patient to continue to perform HEP upon discharge. Patient in agreement with discharge from PT services at this time with all goals met.    Personal Factors and Comorbidities  Comorbidity 3+;Time since onset of injury/illness/exacerbation    Comorbidities  obstructive sleep apnea, HTN, herpes zoster    Examination-Activity Limitations  Bend;Bed Mobility;Other   lying down, sitting up and rolling over   Examination-Participation Restrictions  --   playing with his daughter and home projects like building/assembling items   Stability/Clinical Decision Making  Evolving/Moderate complexity    Rehab Potential  Excellent    PT Frequency  1x / week    PT Duration  12 weeks    PT Treatment/Interventions  Canalith Repostioning;Balance training;Neuromuscular re-education;Therapeutic exercise;Gait training;Stair training;Therapeutic activities;Patient/family education;Vestibular    PT Next Visit Plan  repeat right Dix-Hallpike test and perform EPley maneuver if indicated    PT Home Exercise Plan  [employer] Access Code [employer] [facility] [employer];  Access Code: [email or web address]; tapping to canned goods placed on the floor while standing on a pillow and slow marching standing on pillow with 5 second holds with horizontal head turns    Consulted and Agree with Plan of Care  Patient           Patient will benefit from skilled therapeutic intervention in order to improve the following deficits and  impairments:  Decreased balance, Dizziness  Visit Diagnosis: Dizziness and giddiness     Problem List There are no problems to display for this patient.  Lady Deutscher PT, DPT [phone] Lady Deutscher 09/21/2019, 11:22 AM  [place] [address] [place], [place], [address] Phone: [phone]   Fax:  [phone]  Name: Erik Castaneda MRN: [id number] Date of Birth: 10/12/1985

## 2019-09-28 ENCOUNTER — Ambulatory Visit: Payer: BC Managed Care – PPO
